# Patient Record
Sex: Female | Born: 1995 | Race: White | Hispanic: No | Marital: Single | Smoking: Current every day smoker
Health system: Southern US, Community
[De-identification: ages and names within clinical notes are randomized; demographics above are authoritative.]

## PROBLEM LIST (undated history)

## (undated) DIAGNOSIS — D649 Anemia, unspecified: Secondary | ICD-10-CM

## (undated) HISTORY — DX: Anemia, unspecified: D64.9

---
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
## 2018-03-27 DIAGNOSIS — F4310 Post-traumatic stress disorder, unspecified: Secondary | ICD-10-CM | POA: Insufficient documentation

## 2018-03-27 DIAGNOSIS — G4730 Sleep apnea, unspecified: Secondary | ICD-10-CM | POA: Insufficient documentation

## 2018-03-27 DIAGNOSIS — Z62810 Personal history of physical and sexual abuse in childhood: Secondary | ICD-10-CM | POA: Insufficient documentation

## 2018-03-27 LAB — HM HIV SCREENING LAB: HM HIV Screening: NEGATIVE

## 2018-05-09 ENCOUNTER — Encounter: Payer: Self-pay | Admitting: Emergency Medicine

## 2018-05-09 ENCOUNTER — Other Ambulatory Visit: Payer: Self-pay

## 2018-05-09 DIAGNOSIS — Z8619 Personal history of other infectious and parasitic diseases: Secondary | ICD-10-CM | POA: Diagnosis not present

## 2018-05-09 DIAGNOSIS — B373 Candidiasis of vulva and vagina: Secondary | ICD-10-CM | POA: Insufficient documentation

## 2018-05-09 DIAGNOSIS — N898 Other specified noninflammatory disorders of vagina: Secondary | ICD-10-CM | POA: Diagnosis present

## 2018-05-09 LAB — CBC
HEMATOCRIT: 34 % — AB (ref 35.0–47.0)
Hemoglobin: 12 g/dL (ref 12.0–16.0)
MCH: 30.6 pg (ref 26.0–34.0)
MCHC: 35.3 g/dL (ref 32.0–36.0)
MCV: 86.5 fL (ref 80.0–100.0)
PLATELETS: 232 10*3/uL (ref 150–440)
RBC: 3.93 MIL/uL (ref 3.80–5.20)
RDW: 15 % — AB (ref 11.5–14.5)
WBC: 9.7 10*3/uL (ref 3.6–11.0)

## 2018-05-09 LAB — POCT PREGNANCY, URINE: Preg Test, Ur: NEGATIVE

## 2018-05-09 NOTE — ED Triage Notes (Signed)
Pt diagnosed with chlamydia over a week ago; says taking antibiotic as prescribed but feeling worse; having abd pain, back and and vaginal itching; abd pain to lower midline; denies N/V; reports less urine output with dysuria;

## 2018-05-10 ENCOUNTER — Emergency Department
Admission: EM | Admit: 2018-05-10 | Discharge: 2018-05-10 | Disposition: A | Discharge status: Home / Self Care | Payer: Medicaid Other | Attending: Emergency Medicine | Admitting: Emergency Medicine

## 2018-05-10 DIAGNOSIS — B373 Candidiasis of vulva and vagina: Secondary | ICD-10-CM

## 2018-05-10 DIAGNOSIS — B3731 Acute candidiasis of vulva and vagina: Secondary | ICD-10-CM

## 2018-05-10 LAB — URINALYSIS, COMPLETE (UACMP) WITH MICROSCOPIC
Bilirubin Urine: NEGATIVE
Glucose, UA: NEGATIVE mg/dL
Hgb urine dipstick: NEGATIVE
Ketones, ur: 5 mg/dL — AB
Nitrite: NEGATIVE
PROTEIN: 30 mg/dL — AB
SPECIFIC GRAVITY, URINE: 1.034 — AB (ref 1.005–1.030)
pH: 5 (ref 5.0–8.0)

## 2018-05-10 LAB — COMPREHENSIVE METABOLIC PANEL
ALBUMIN: 3.9 g/dL (ref 3.5–5.0)
ALK PHOS: 59 U/L (ref 38–126)
ALT: 12 U/L — AB (ref 14–54)
AST: 19 U/L (ref 15–41)
Anion gap: 7 (ref 5–15)
BILIRUBIN TOTAL: 0.3 mg/dL (ref 0.3–1.2)
BUN: 9 mg/dL (ref 6–20)
CALCIUM: 9.1 mg/dL (ref 8.9–10.3)
CO2: 25 mmol/L (ref 22–32)
CREATININE: 0.77 mg/dL (ref 0.44–1.00)
Chloride: 107 mmol/L (ref 101–111)
GFR calc Af Amer: >60 mL/min (ref >60)
GLUCOSE: 85 mg/dL (ref 65–99)
POTASSIUM: 3.7 mmol/L (ref 3.5–5.1)
Sodium: 139 mmol/L (ref 135–145)
TOTAL PROTEIN: 6.9 g/dL (ref 6.5–8.1)

## 2018-05-10 LAB — CHLAMYDIA/NGC RT PCR (ARMC ONLY)
Chlamydia Tr: NOT DETECTED
N gonorrhoeae: NOT DETECTED

## 2018-05-10 LAB — WET PREP, GENITAL
CLUE CELLS WET PREP: NONE SEEN
SPERM: NONE SEEN
Trich, Wet Prep: NONE SEEN

## 2018-05-10 MED ORDER — FLUCONAZOLE 100 MG PO TABS
150.0000 mg | ORAL_TABLET | Freq: Once | ORAL | Status: AC
  Administered 2018-05-10: 150 mg via ORAL
  Filled 2018-05-10: qty 1

## 2018-05-10 NOTE — ED Provider Notes (Signed)
[facility] Emergency Department Provider Note  Time seen: 3:11 AM  I have reviewed the triage vital signs and the nursing notes.   HISTORY  Chief Complaint Abdominal Pain; Dysuria; and Vaginal Itching    HPI Janet Leon is a 22 y.o. female with no significant past medical history presents to the emergency department for vaginal discharge itching burning.  According to the patient approximately 10 days ago she was seen for vaginal discharge and lower abdominal pain diagnosed with chlamydia.  Patient was placed on antibiotics which she took twice a day for 7 days.  States the vaginal discharge improved and lower abdominal pain went away.  Patient states over the past few days she is now having burning and itching vaginally.  Denies any fever.  Denies any current abdominal pain, nausea or vomiting.   History reviewed. No pertinent past medical history.  There are no active problems to display for this patient.   History reviewed. No pertinent surgical history.  Prior to Admission medications   Medication Sig Start Date End Date Taking? Authorizing Provider  doxycycline (MONODOX) 100 MG capsule Take 100 mg by mouth 2 (two) times daily.   Yes [provider]    Allergies  Allergen Reactions  . Amoxicillin Rash  . Penicillins Rash    History reviewed. No pertinent family history.  Social History Social History   Tobacco Use  . Smoking status: Never Smoker  . Smokeless tobacco: Never Used  Substance Use Topics  . Alcohol use: No  . Drug use: Never    Review of Systems Constitutional: Negative for fever. Cardiovascular: Negative for chest pain. Respiratory: Negative for shortness of breath. Gastrointestinal: Lower abdominal pain has resolved. Genitourinary: Vaginal burning and itching.  Minimal discharge. All other ROS negative  ____________________________________________   PHYSICAL EXAM:  VITAL SIGNS: ED Triage Vitals  Enc  Vitals Group     BP 05/09/18 2319 123/78     Pulse Rate 05/09/18 2319 81     Resp 05/09/18 2319 16     Temp 05/09/18 2319 98.5 F (36.9 C)     Temp Source 05/09/18 2319 Oral     SpO2 05/09/18 2319 98 %     Weight 05/09/18 2324 106 lb (48.1 kg)     Height 05/09/18 2324 5\' 4"  (1.626 m)     Head Circumference --      Peak Flow --      Pain Score 05/09/18 2323 4     Pain Loc --      Pain Edu? --      Excl. in GC? --     Constitutional: Alert and oriented. Well appearing Eyes: Normal exam ENT   Head: Normocephalic and atraumatic   Mouth/Throat: Mucous membranes are moist. Cardiovascular: Normal rate, regular rhythm. No murmur Respiratory: Normal respiratory effort without tachypnea nor retractions. Breath sounds are clear  Gastrointestinal: Soft and nontender. No distention.  Genitourinary: Patient has white sparse vaginal discharge most consistent with Candida.  No cervical erythema.  Largely nontender. Musculoskeletal: Nontender with normal range of motion in all extremities.  Neurologic:  Normal speech and language. No gross focal neurologic deficits Skin:  Skin is warm, dry and intact.  Psychiatric: Mood and affect are normal.   ____________________________________________    INITIAL IMPRESSION / ASSESSMENT AND PLAN / ED COURSE  Pertinent labs & imaging results that were available during my care of the patient were reviewed by me and considered in my medical decision making (see chart for  details).  Patient presents to the emergency department for vaginal burning and itching.  Patient recently completed a course of antibiotics for chlamydia.  Differential would include resistant chlamydia, candida, BV.  Overall the patient appears well.  Pelvic examination is most consistent with yeast infection.  We will send a wet prep and repeat a GC/chlamydia.  Patient agreeable to plan of care.  Patient's blood work is largely normal.  Urinalysis shows white cells, somewhat  equivocal we will send urine culture.  Positive for yeast infection.  We will treat with Diflucan and have the patient follow-up with her doctor. ____________________________________________   FINAL CLINICAL IMPRESSION(S) / ED DIAGNOSES  Vaginal yeast infection    Minna Antis, MD 05/10/18 [phone]

## 2018-05-10 NOTE — ED Notes (Signed)
Pt waiting patiently in [facility] for treatment room;  

## 2018-05-11 LAB — URINE CULTURE: Culture: <10000 — AB

## 2018-05-25 ENCOUNTER — Emergency Department: Payer: Medicaid Other

## 2018-05-25 ENCOUNTER — Other Ambulatory Visit: Payer: Self-pay

## 2018-05-25 ENCOUNTER — Encounter: Payer: Self-pay | Admitting: Emergency Medicine

## 2018-05-25 ENCOUNTER — Telehealth: Payer: Self-pay | Admitting: Emergency Medicine

## 2018-05-25 ENCOUNTER — Emergency Department
Admission: EM | Admit: 2018-05-25 | Discharge: 2018-05-25 | Disposition: A | Discharge status: Home / Self Care | Payer: Medicaid Other | Attending: Emergency Medicine | Admitting: Emergency Medicine

## 2018-05-25 DIAGNOSIS — R1030 Lower abdominal pain, unspecified: Secondary | ICD-10-CM | POA: Insufficient documentation

## 2018-05-25 LAB — URINALYSIS, COMPLETE (UACMP) WITH MICROSCOPIC
BILIRUBIN URINE: NEGATIVE
GLUCOSE, UA: NEGATIVE mg/dL
HGB URINE DIPSTICK: NEGATIVE
Ketones, ur: NEGATIVE mg/dL
LEUKOCYTES UA: NEGATIVE
NITRITE: NEGATIVE
PROTEIN: 30 mg/dL — AB
Specific Gravity, Urine: 1.017 (ref 1.005–1.030)
pH: 6 (ref 5.0–8.0)

## 2018-05-25 LAB — CHLAMYDIA/NGC RT PCR (ARMC ONLY)
Chlamydia Tr: NOT DETECTED
N gonorrhoeae: NOT DETECTED

## 2018-05-25 LAB — WET PREP, GENITAL
Clue Cells Wet Prep HPF POC: NONE SEEN
SPERM: NONE SEEN
TRICH WET PREP: NONE SEEN
YEAST WET PREP: NONE SEEN

## 2018-05-25 LAB — POCT PREGNANCY, URINE: Preg Test, Ur: NEGATIVE

## 2018-05-25 NOTE — Telephone Encounter (Signed)
Called patient to give her results of std testing.  I explained that the gonorrhea and chlamydia tests were negative.  Also asked about her ovary and the cyst.  I explained that there was a cyst, but that may not be the source of her pain.  She is going to pcp for follow up. I told her that if she continues to have pelvic pain she should ask her pcp about next steps for follow up.

## 2018-05-25 NOTE — ED Triage Notes (Signed)
Patient to ED for lower abdominal cramping. States she did finish her antibiotics as did her partner. This pain has been going on for a couple of days.

## 2018-05-25 NOTE — Discharge Instructions (Signed)
Fortunately today your ultrasound was reassuring.  Please follow-up with your primary care physician in 2 days for recheck and return to the emergency department sooner for any concerns.  It was a pleasure to take care of you today, and thank you for coming to our emergency department.  If you have any questions or concerns before leaving please ask the nurse to grab me and I'm more than happy to go through your aftercare instructions again.  If you were prescribed any opioid pain medication today such as Norco, Vicodin, Percocet, morphine, hydrocodone, or oxycodone please make sure you do not drive when you are taking this medication as it can alter your ability to drive safely.  If you have any concerns once you are home that you are not improving or are in fact getting worse before you can make it to your follow-up appointment, please do not hesitate to call 911 and come back for further evaluation.  Merrily BrittleNeil Ronee Ranganathan, MD  Results for orders placed or performed during the hospital encounter of 05/25/18  Wet prep, genital  Result Value Ref Range   Yeast Wet Prep HPF POC NONE SEEN NONE SEEN   Trich, Wet Prep NONE SEEN NONE SEEN   Clue Cells Wet Prep HPF POC NONE SEEN NONE SEEN   WBC, Wet Prep HPF POC FEW (A) NONE SEEN   Sperm NONE SEEN   Urinalysis, Complete w Microscopic  Result Value Ref Range   Color, Urine YELLOW (A) YELLOW   APPearance CLEAR (A) CLEAR   Specific Gravity, Urine 1.017 1.005 - 1.030   pH 6.0 5.0 - 8.0   Glucose, UA NEGATIVE NEGATIVE mg/dL   Hgb urine dipstick NEGATIVE NEGATIVE   Bilirubin Urine NEGATIVE NEGATIVE   Ketones, ur NEGATIVE NEGATIVE mg/dL   Protein, ur 30 (A) NEGATIVE mg/dL   Nitrite NEGATIVE NEGATIVE   Leukocytes, UA NEGATIVE NEGATIVE   RBC / HPF 0-5 0 - 5 RBC/hpf   WBC, UA 0-5 0 - 5 WBC/hpf   Bacteria, UA RARE (A) NONE SEEN   Squamous Epithelial / LPF 0-5 0 - 5   Mucus PRESENT    Hyaline Casts, UA PRESENT   Pregnancy, urine POC  Result Value Ref  Range   Preg Test, Ur NEGATIVE NEGATIVE   [place] Transvaginal Non-ob  Result Date: 05/25/2018 CLINICAL DATA:  Initial evaluation for acute lower abdominal pain for 2 days. Evaluate for TOA. EXAM: TRANSABDOMINAL AND TRANSVAGINAL ULTRASOUND OF PELVIS TECHNIQUE: Both transabdominal and transvaginal ultrasound examinations of the pelvis were performed. Transabdominal technique was performed for global imaging of the pelvis including uterus, ovaries, adnexal regions, and pelvic cul-de-sac. It was necessary to proceed with endovaginal exam following the transabdominal exam to visualize the pelvic structures. COMPARISON:  None FINDINGS: Uterus Measurements: 7.5 x 2.9 x 4.1 cm. No fibroids or other mass visualized. Endometrium Thickness: 7 mm.  No focal abnormality visualized. Right ovary Measurements: 3.5 x 2.2 x 2.9 cm. Normal appearance/no adnexal mass. 1.8 x 1.7 x 2.3 cm dominant follicle/physiologic cyst noted. Left ovary Measurements: 2.1 x 1.6 x 2.0 cm. Normal appearance/no adnexal mass. Other findings No abnormal free fluid. IMPRESSION: 1. No acute abnormality within the pelvis.  No evidence for TOA. 2. 2.3 cm right ovarian physiologic cyst. Electronically Signed   By: Rise MuBenjamin  McClintock M.D.   On: 05/25/2018 05:02   [place] Pelvis Complete  Result Date: 05/25/2018 CLINICAL DATA:  Initial evaluation for acute lower abdominal pain for 2 days. Evaluate for TOA. EXAM: TRANSABDOMINAL AND TRANSVAGINAL ULTRASOUND  OF PELVIS TECHNIQUE: Both transabdominal and transvaginal ultrasound examinations of the pelvis were performed. Transabdominal technique was performed for global imaging of the pelvis including uterus, ovaries, adnexal regions, and pelvic cul-de-sac. It was necessary to proceed with endovaginal exam following the transabdominal exam to visualize the pelvic structures. COMPARISON:  None FINDINGS: Uterus Measurements: 7.5 x 2.9 x 4.1 cm. No fibroids or other mass visualized. Endometrium Thickness: 7 mm.  No focal  abnormality visualized. Right ovary Measurements: 3.5 x 2.2 x 2.9 cm. Normal appearance/no adnexal mass. 1.8 x 1.7 x 2.3 cm dominant follicle/physiologic cyst noted. Left ovary Measurements: 2.1 x 1.6 x 2.0 cm. Normal appearance/no adnexal mass. Other findings No abnormal free fluid. IMPRESSION: 1. No acute abnormality within the pelvis.  No evidence for TOA. 2. 2.3 cm right ovarian physiologic cyst. Electronically Signed   By: Rise Mu M.D.   On: 05/25/2018 05:02

## 2018-05-25 NOTE — ED Provider Notes (Signed)
[facility] Emergency Department Provider Note  ____________________________________________   First MD Initiated Contact with Patient 05/25/18 [phone]     (approximate)  I have reviewed the triage vital signs and the nursing notes.   HISTORY  Chief Complaint Abdominal Pain   HPI Janet Leon is a 22 y.o. female who self presents to the emergency department with roughly 2 days of lower abdominal cramping discomfort.  Her symptoms initially began about 3-1/2 weeks ago when she was diagnosed with chlamydia.  She [facility] with a "shot and some pills".  Her partner was seen and treated as well.  She was then seen in our emergency department about 2 weeks ago with persistent symptoms and received an additional course of antibiotics given the concern for persistent chlamydia.  Her symptoms then subsequently resolved until 2 days ago.  She denies dysuria however she does note increasing vaginal discharge.  She has not had sex in about 10 days.  She denies fevers or chills.  Her abdominal pain is mild to moderate lower nothing seems to make it better or worse.  It is mostly constant and sharp.  It is nonradiating.    History reviewed. No pertinent past medical history.  There are no active problems to display for this patient.   History reviewed. No pertinent surgical history.  Prior to Admission medications   Medication Sig Start Date End Date Taking? Authorizing Provider  doxycycline (MONODOX) 100 MG capsule Take 100 mg by mouth 2 (two) times daily.    [provider]    Allergies Amoxicillin and Penicillins  No family history on file.  Social History Social History   Tobacco Use  . Smoking status: Never Smoker  . Smokeless tobacco: Never Used  Substance Use Topics  . Alcohol use: No  . Drug use: Never    Review of Systems Constitutional: No fever/chills Eyes: No visual changes. ENT: No sore  throat. Cardiovascular: Denies chest pain. Respiratory: Denies shortness of breath. Gastrointestinal: Positive for abdominal pain.  Positive for nausea, no vomiting.  No diarrhea.  No constipation. Genitourinary: Positive for vaginal discharge Musculoskeletal: Negative for back pain. Skin: Negative for rash. Neurological: Negative for headaches, focal weakness or numbness.   ____________________________________________   PHYSICAL EXAM:  VITAL SIGNS: ED Triage Vitals  Enc Vitals Group     BP 05/25/18 0251 102/60     Pulse Rate 05/25/18 0251 99     Resp 05/25/18 0251 19     Temp 05/25/18 0251 98.7 F (37.1 C)     Temp src --      SpO2 05/25/18 0251 100 %     Weight 05/25/18 0248 106 lb (48.1 kg)     Height 05/25/18 0248 5\' 4"  (1.626 m)     Head Circumference --      Peak Flow --      Pain Score 05/25/18 0248 4     Pain Loc --      Pain Edu? --      Excl. in GC? --     Constitutional: Alert and oriented x4 well-appearing nontoxic no diaphoresis speaks in full clear sentences Eyes: PERRL EOMI. Head: Atraumatic. Nose: No congestion/rhinnorhea. Mouth/Throat: No trismus Neck: No stridor.   Cardiovascular: Normal rate, regular rhythm. Grossly normal heart sounds.  Good peripheral circulation. Respiratory: Normal respiratory effort.  No retractions. Lungs CTAB and moving good air Gastrointestinal: Soft nondistended mild lower abdominal tenderness with no rebound or guarding no peritonitis no  McBurney's tenderness Pelvic exam performed with female chaperone Lashea: Normal external exam.  Small amount of mucoid discharge from the cervix.  No cervical motion adnexal or uterine tenderness Musculoskeletal: No lower extremity edema   Neurologic:  Normal speech and language. No gross focal neurologic deficits are appreciated. Skin:  Skin is warm, dry and intact. No rash noted. Psychiatric: Mood and affect are normal. Speech and behavior are  normal.    ____________________________________________   DIFFERENTIAL includes but not limited to  Tubo-ovarian abscess, PID, cervicitis, gonorrhea diverticulitis, ovarian cyst ____________________________________________   LABS (all labs ordered are listed, but only abnormal results are displayed)  Labs Reviewed  WET PREP, GENITAL - Abnormal; Notable for the following components:      Result Value   WBC, Wet Prep HPF POC FEW (*)    All other components within normal limits  URINALYSIS, COMPLETE (UACMP) WITH MICROSCOPIC - Abnormal; Notable for the following components:   Color, Urine YELLOW (*)    APPearance CLEAR (*)    Protein, ur 30 (*)    Bacteria, UA RARE (*)    All other components within normal limits  CHLAMYDIA/NGC RT PCR ([facility] ONLY)  POC URINE PREG, ED  POCT PREGNANCY, URINE    Lab work reviewed by me with no acute disease noted __________________________________________  EKG   ____________________________________________  RADIOLOGY  Pelvic ultrasound reviewed by me with right sided cyst otherwise unremarkable ____________________________________________   PROCEDURES  Procedure(s) performed: no  Procedures  Critical Care performed: no  ____________________________________________   INITIAL IMPRESSION / ASSESSMENT AND PLAN / ED COURSE  Pertinent labs & imaging results that were available during my care of the patient were reviewed by me and considered in my medical decision making (see chart for details).   The patient arrives quite well-appearing although with recurrent pelvic symptoms in the setting of a recent diagnosis of chlamydia.  I do have some concern for TOA versus recurrent PID so pelvic ultrasound and pelvic exam are pending.  The patient's pelvic ultrasound is unremarkable.  Her pelvic exam was reassuring.  I have sent GC/CT but I reassured the patient.  2-day abdominal recheck given.  She is discharged home in stable  condition verbalizes understanding and agreement with the plan.      ____________________________________________   FINAL CLINICAL IMPRESSION(S) / ED DIAGNOSES  Final diagnoses:  Lower abdominal pain      NEW MEDICATIONS STARTED DURING THIS VISIT:  Discharge Medication List as of 05/25/2018  5:34 AM       Note:  This document was prepared using Dragon voice recognition software and may include unintentional dictation errors.     Merrily Brittle, MD 05/25/18 [phone]

## 2018-05-25 NOTE — ED Notes (Signed)
Pt states is here for generalized abd pain. Pt states pain is sometimes in lower abd, pelvis and ruq. Pt appears in no acute distress. Pt denies fever, diarrhea, nausea, vomiting. Pt appears in no acute distress.

## 2019-01-07 DIAGNOSIS — G4730 Sleep apnea, unspecified: Secondary | ICD-10-CM

## 2019-01-07 DIAGNOSIS — F4310 Post-traumatic stress disorder, unspecified: Secondary | ICD-10-CM

## 2019-01-07 DIAGNOSIS — F32A Depression, unspecified: Secondary | ICD-10-CM

## 2019-01-07 DIAGNOSIS — F329 Major depressive disorder, single episode, unspecified: Secondary | ICD-10-CM

## 2019-01-07 DIAGNOSIS — Z62810 Personal history of physical and sexual abuse in childhood: Secondary | ICD-10-CM

## 2019-08-05 ENCOUNTER — Encounter: Payer: Self-pay | Admitting: Family Medicine

## 2019-08-05 ENCOUNTER — Ambulatory Visit (LOCAL_COMMUNITY_HEALTH_CENTER): Payer: Self-pay | Admitting: Family Medicine

## 2019-08-05 ENCOUNTER — Other Ambulatory Visit: Payer: Self-pay

## 2019-08-05 ENCOUNTER — Ambulatory Visit: Payer: Self-pay | Admitting: Family Medicine

## 2019-08-05 DIAGNOSIS — Z01419 Encounter for gynecological examination (general) (routine) without abnormal findings: Secondary | ICD-10-CM

## 2019-08-05 DIAGNOSIS — Z113 Encounter for screening for infections with a predominantly sexual mode of transmission: Secondary | ICD-10-CM

## 2019-08-05 DIAGNOSIS — B379 Candidiasis, unspecified: Secondary | ICD-10-CM

## 2019-08-05 DIAGNOSIS — N898 Other specified noninflammatory disorders of vagina: Secondary | ICD-10-CM

## 2019-08-05 DIAGNOSIS — F1290 Cannabis use, unspecified, uncomplicated: Secondary | ICD-10-CM

## 2019-08-05 LAB — WET PREP FOR TRICH, YEAST, CLUE: Trichomonas Exam: NEGATIVE

## 2019-08-05 MED ORDER — CLOTRIMAZOLE 1 % VA CREA: 1.0000 | TOPICAL_CREAM | Freq: Every day | VAGINAL | 0 refills | Status: AC
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
## 2019-08-05 NOTE — Progress Notes (Signed)
Wet mount reviewed, pt treated for yeast per standing order. Provider orders completed. 
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
## 2019-08-09 LAB — GONOCOCCUS CULTURE

## 2019-08-11 LAB — IGP, RFX APTIMA HPV ASCU: PAP Smear Comment: 0

## 2019-08-12 LAB — HEPATITIS B SURFACE ANTIGEN

## 2019-08-17 LAB — HM HEPATITIS C SCREENING LAB: HM Hepatitis Screen: NEGATIVE

## 2019-08-17 LAB — HM HIV SCREENING LAB: HM HIV Screening: NEGATIVE

## 2019-08-25 ENCOUNTER — Telehealth: Payer: Self-pay | Admitting: Family Medicine

## 2019-08-25 ENCOUNTER — Telehealth: Payer: Self-pay | Admitting: General Practice

## 2019-08-25 NOTE — Telephone Encounter (Signed)
Test Results

## 2019-08-25 NOTE — Telephone Encounter (Signed)
WANTS TR °

## 2019-08-26 NOTE — Telephone Encounter (Signed)
See other phone note Lea Walbert, RN  

## 2019-08-26 NOTE — Telephone Encounter (Signed)
TC with patient.  Verified ID via password.  Discussed neg HIV/RPR/Hep/ and GC/Chlamydia. Patient reports received pap card. Aileen Fass, RN

## 2019-09-01 NOTE — Addendum Note (Signed)
Addended by: Caryl Bis on: 09/01/2019 05:57 PM   Modules accepted: Orders

## 2019-10-24 IMAGING — US US TRANSVAGINAL NON-OB
1 series · 14 of 25 positions shown · non-contrast
Comparison: None

CLINICAL DATA: Initial evaluation for acute lower abdominal pain
for 2 days. Evaluate for TOA.

EXAM:
TRANSABDOMINAL AND TRANSVAGINAL ULTRASOUND OF PELVIS
TECHNIQUE: Both transabdominal and transvaginal ultrasound examinations of the
pelvis were performed. Transabdominal technique was performed for
global imaging of the pelvis including uterus, ovaries, adnexal
regions, and pelvic cul-de-sac. It was necessary to proceed with
endovaginal exam following the transabdominal exam to visualize the
pelvic structures.

[Series 1: us transvaginal non-ob · 0.17mm/px · 14 of 114 slices shown]
[im 1/114]
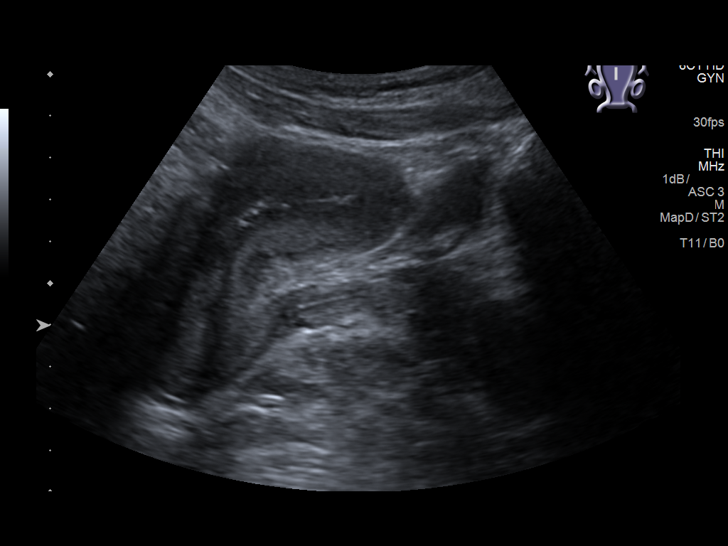
[im 10/114]
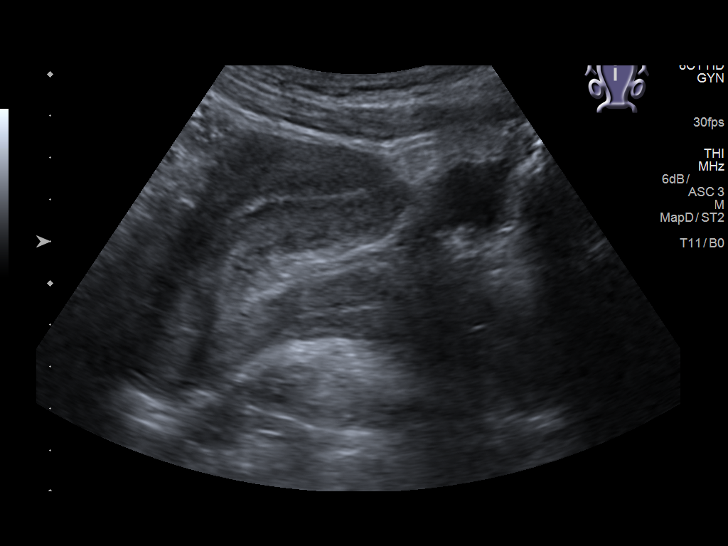
[im 19/114]
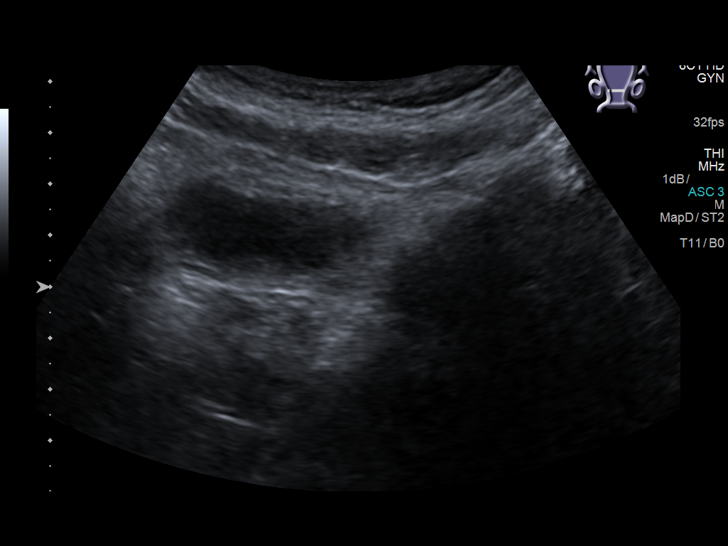
[im 29/114]
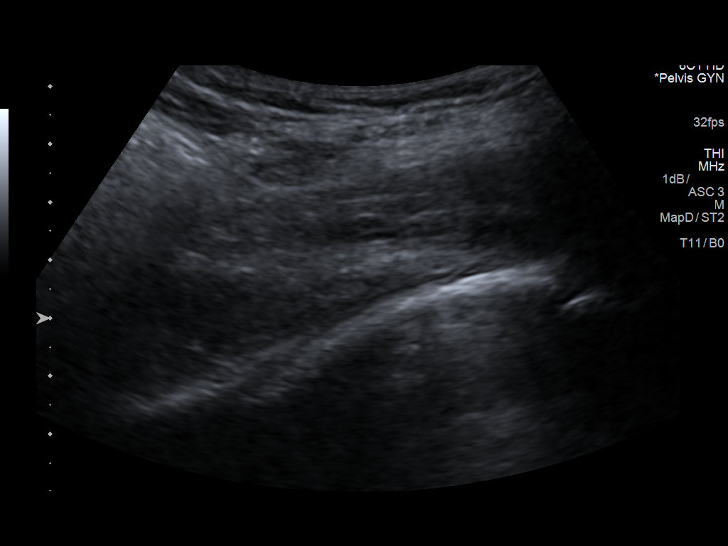
[im 38/114]
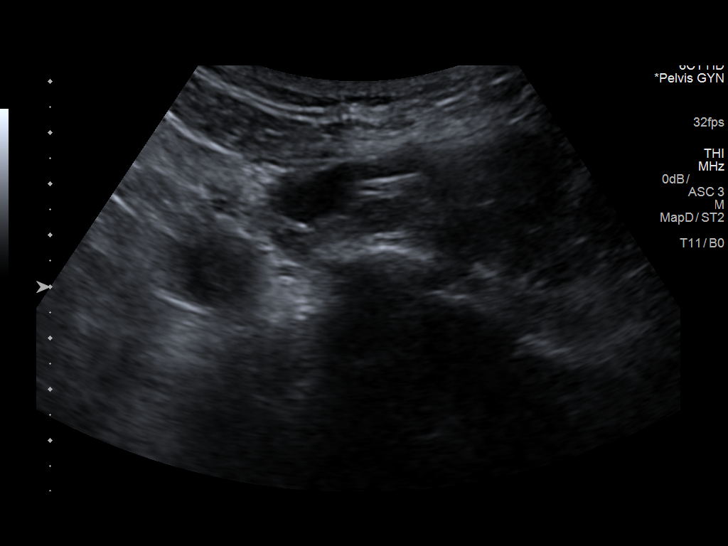
[im 43/114]
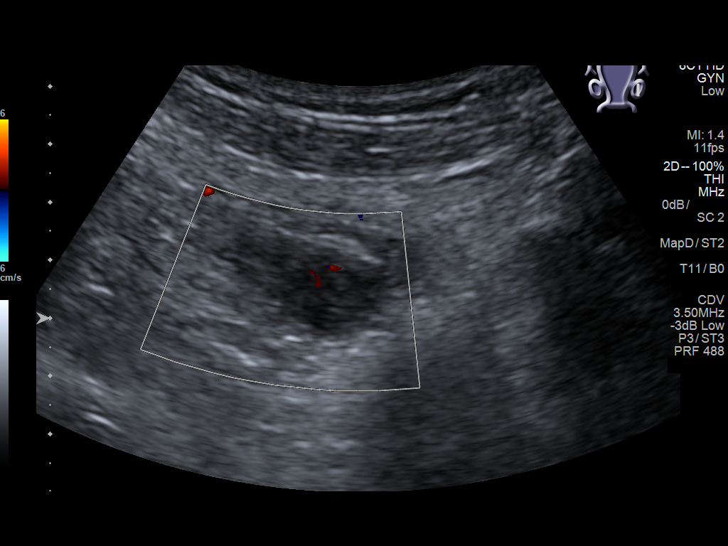
[im 52/114]
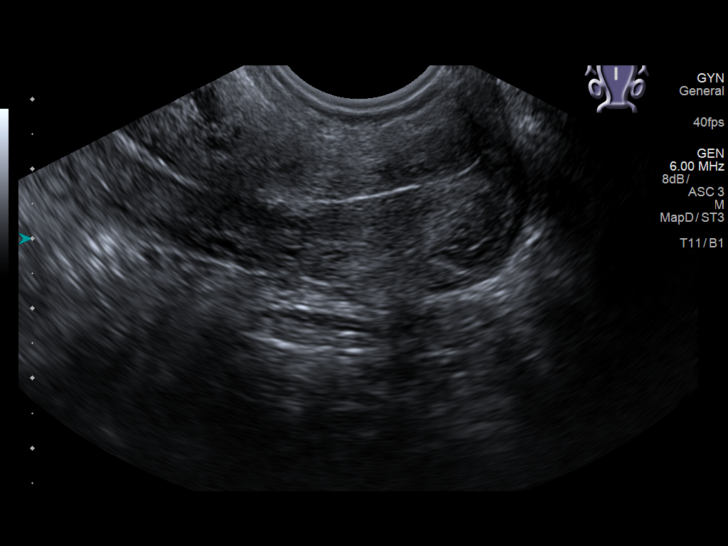
[im 62/114]
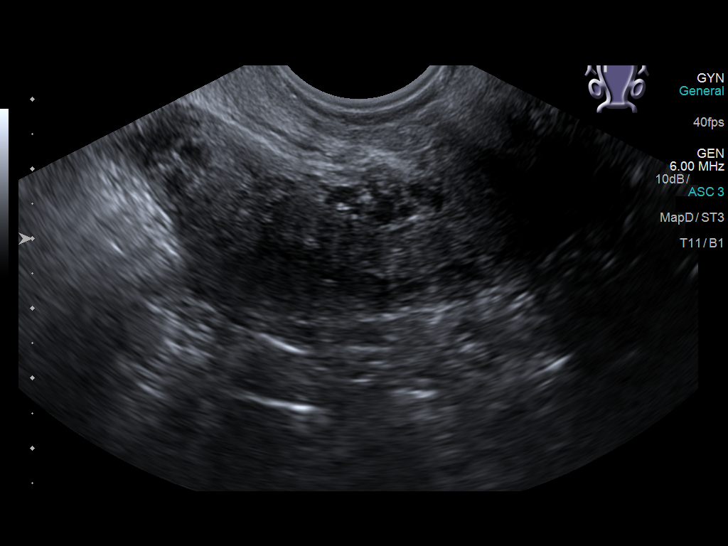
[im 71/114]
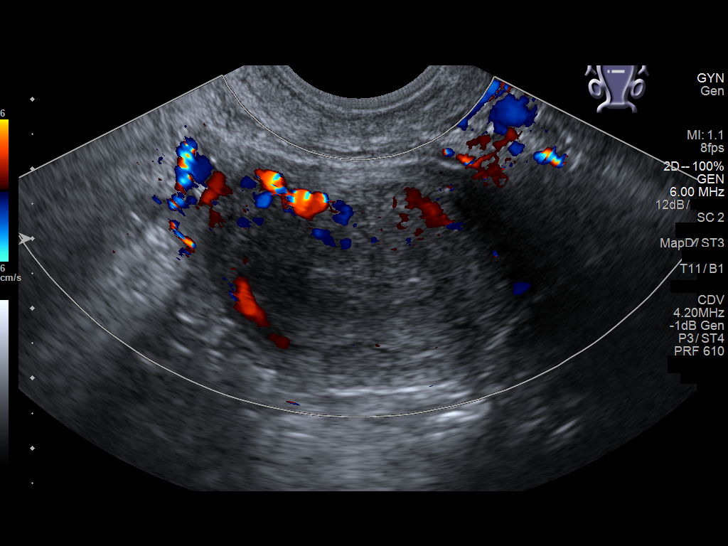
[im 76/114]
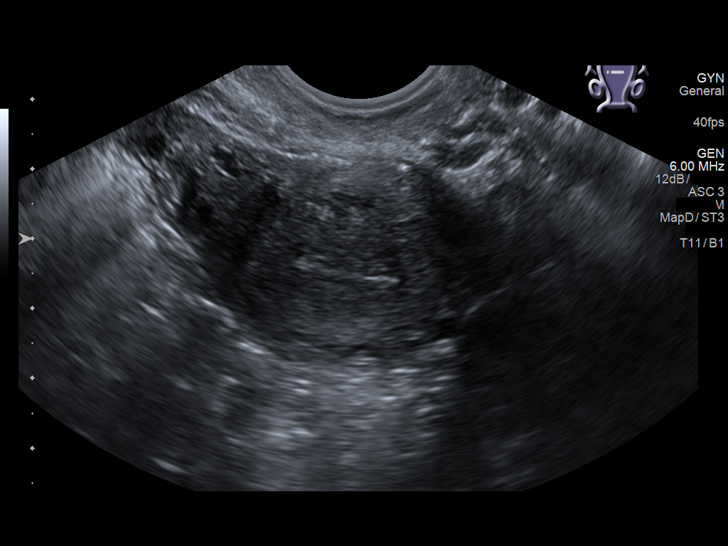
[im 85/114]
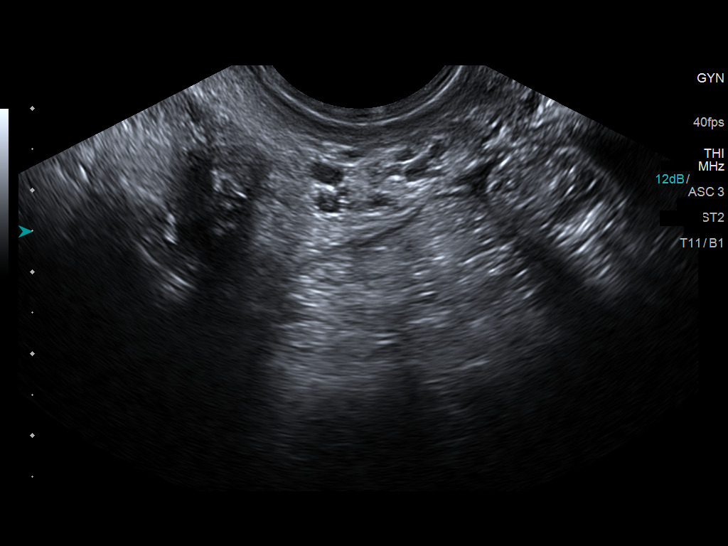
[im 95/114]
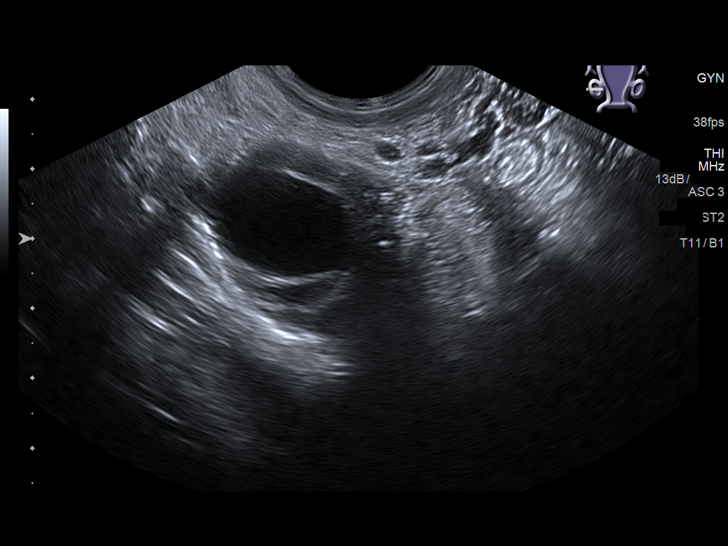
[im 104/114]
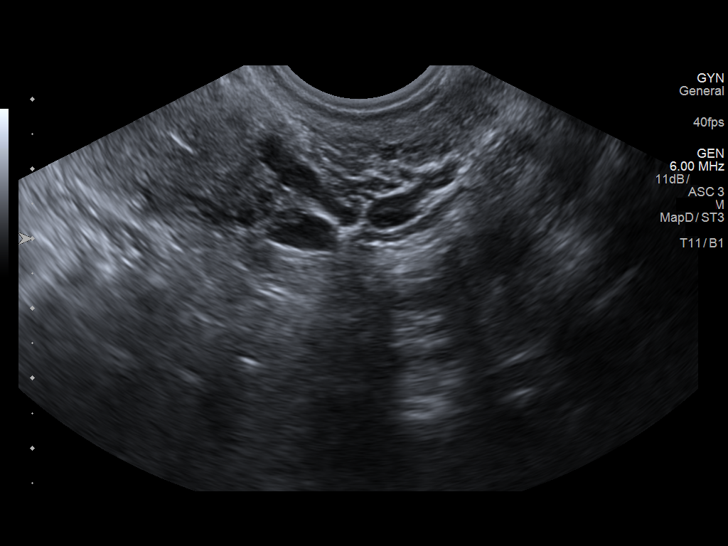
[im 114/114]
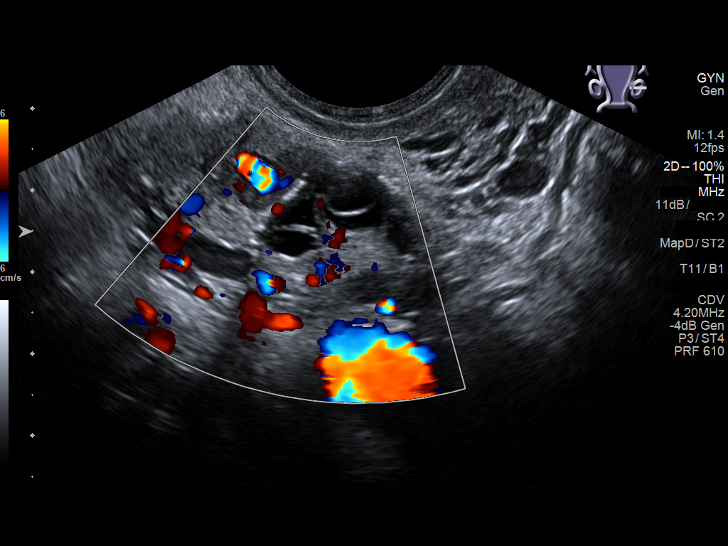

[14 of 25 positions shown; findings below may reference images not displayed]

FINDINGS: Uterus

Measurements: 7.5 x 2.9 x 4.1 cm. No fibroids or other mass
visualized.

Endometrium

Thickness: 7 mm.  No focal abnormality visualized.

Right ovary

Measurements: 3.5 x 2.2 x 2.9 cm. Normal appearance/no adnexal mass.
1.8 x 1.7 x 2.3 cm dominant follicle/physiologic cyst noted.

Left ovary

Measurements: 2.1 x 1.6 x 2.0 cm. Normal appearance/no adnexal mass.

Other findings

No abnormal free fluid.
IMPRESSION: 1. No acute abnormality within the pelvis.  No evidence for TOA.
2. 2.3 cm right ovarian physiologic cyst.

## 2020-08-02 ENCOUNTER — Encounter: Payer: Self-pay | Admitting: Physician Assistant

## 2020-08-02 ENCOUNTER — Ambulatory Visit (LOCAL_COMMUNITY_HEALTH_CENTER): Payer: Self-pay | Admitting: Physician Assistant

## 2020-08-02 ENCOUNTER — Other Ambulatory Visit: Payer: Self-pay

## 2020-08-02 VITALS — BP 111/67 | Ht 64.0 in | Wt 118.0 lb

## 2020-08-02 DIAGNOSIS — F329 Major depressive disorder, single episode, unspecified: Secondary | ICD-10-CM

## 2020-08-02 DIAGNOSIS — Z3009 Encounter for other general counseling and advice on contraception: Secondary | ICD-10-CM

## 2020-08-02 DIAGNOSIS — Z113 Encounter for screening for infections with a predominantly sexual mode of transmission: Secondary | ICD-10-CM

## 2020-08-02 DIAGNOSIS — F32A Depression, unspecified: Secondary | ICD-10-CM

## 2020-08-02 DIAGNOSIS — Z0000 Encounter for general adult medical examination without abnormal findings: Secondary | ICD-10-CM

## 2020-08-02 LAB — WET PREP FOR TRICH, YEAST, CLUE
Trichomonas Exam: NEGATIVE
Yeast Exam: NEGATIVE
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
## 2020-08-02 NOTE — Progress Notes (Signed)
Pt is here for physical and STD screening. Pt reports that she has a lump in her right breast that she discussed with PCP about but has concerns about it. Pt also reports irregular periods that started in 06/2020. Pt reports frequent periods with large blood clots. Pt's last sex was 07/29/2020 without condom. Pt reports she is not interested in birth control today. Pt also reports wants to speak with the provider about a skin issue that she is having. Pt filling out PHQ9.

## 2020-08-03 NOTE — Progress Notes (Signed)
Wet mount reviewed and is negative, so no treatment needed for wet mount per standing order. Pt accepted condoms. Counseled pt per provider orders and pt states understanding. Kathreen Cosier, LCSW card and [employer] cards with contact info given to pt per pt request. Provider orders completed.

## 2020-08-09 ENCOUNTER — Other Ambulatory Visit: Payer: Self-pay | Admitting: Family Medicine

## 2020-08-09 LAB — HM HIV SCREENING LAB: HM HIV Screening: NEGATIVE

## 2020-08-09 LAB — HM HEPATITIS C SCREENING LAB: HM Hepatitis Screen: NEGATIVE

## 2020-08-16 ENCOUNTER — Encounter: Payer: Self-pay | Admitting: Physician Assistant

## 2020-08-17 ENCOUNTER — Telehealth: Payer: Self-pay | Admitting: Family Medicine

## 2020-08-17 NOTE — Telephone Encounter (Signed)
Wants test results clarification

## 2020-09-07 ENCOUNTER — Ambulatory Visit: Payer: Self-pay | Admitting: Licensed Clinical Social Worker

## 2020-09-14 ENCOUNTER — Ambulatory Visit: Payer: Self-pay | Admitting: Licensed Clinical Social Worker

## 2020-09-14 ENCOUNTER — Encounter: Payer: Self-pay | Admitting: Licensed Clinical Social Worker

## 2020-09-14 DIAGNOSIS — F4310 Post-traumatic stress disorder, unspecified: Secondary | ICD-10-CM

## 2020-09-14 DIAGNOSIS — F1220 Cannabis dependence, uncomplicated: Secondary | ICD-10-CM

## 2020-09-14 DIAGNOSIS — F411 Generalized anxiety disorder: Secondary | ICD-10-CM
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
## 2020-09-28 ENCOUNTER — Ambulatory Visit: Payer: Self-pay | Admitting: Licensed Clinical Social Worker

## 2020-09-28 DIAGNOSIS — F1220 Cannabis dependence, uncomplicated: Secondary | ICD-10-CM

## 2020-09-28 DIAGNOSIS — F4310 Post-traumatic stress disorder, unspecified: Secondary | ICD-10-CM

## 2020-09-28 DIAGNOSIS — F411 Generalized anxiety disorder: Secondary | ICD-10-CM

## 2020-09-28 NOTE — Progress Notes (Signed)
Counselor/Therapist Progress Note  Patient ID: Tamerra Merkley, MRN: [id number],    Date: 09/28/2020  Time Spent: 47 minutes    Treatment Type: Individual Therapy  Reported Symptoms: Lack of motivation and tired, anxiety, irritability  Mental Status Exam:  Appearance:   Casual and Neat     Behavior:  Appropriate and Sharing  Motor:  Normal  Speech/Language:   Normal Rate  Affect:  Appropriate and Congruent  Mood:  normal  Tho ught process:  normal  Thought content:    WNL  Sensory/Perceptual disturbances:    WNL  Orientation:  oriented to person, place and time/date  Attention:  Good  Concentration:  Fair  Memory:  WNL  Fund of knowledge:   Good  Insight:    Fair  Judgment:   Good  Impulse Control:  Good   Risk Assessment: Danger to Self:  No Self-injurious Behavior: No Danger to Others: No Duty to Warn:no Physical Aggression / Violence:No  Access to Firearms a concern: No  Gang Involvement:No   Subjective: Patient was engaged and cooperative throughout the session using time effectively to discuss goal of treatment and treatment plan. Patient voices continued motivation for treatment and understanding of anxiety issues. Patient is likely to benefit from future treatment because she is motivated to decrease symptoms and improve functioning.   Interventions: Cognitive Behavioral Therapy  Checked in with patient and reviewed previous session, including assessment and goal of treatment. Provided psychoeducation on CBTs. Explored patient's goal of treatment and worked collaboratively to develop CBT treatment plan. Provided support through active listening, validation of feelings, and highlighted patient's strengths.  Diagnosis:   ICD-10-CM   1. Generalized anxiety disorder  F41.1   2. PTSD (post-traumatic stress disorder)  F43.10   3. Cannabis dependence, continuous use (HCC)  F12.20     Plan: Patient's goal is to work on herself, to get her "life together"   Treatment  Target: Understand the relationship between thoughts, emotions, and behaviors  - Psychoeducation on CBT model   - Teach the connection between thoughts, emotions, and behaviors   Treatment Target: Continue to understand mood and anxiety symptoms  - Describe current and past experiences with mood and anxiety symptoms  - Assess anger, depression and anxiety   Treatment Target: Increase realistic balanced thinking  - Explore patient's thoughts, beliefs, automatic thoughts, assumptions  - Identify hot thoughts (upsetting ideas, self-talk and mental images) - Process distress and allow for emotional release  - Evaluate thoughts - Cognitive reappraisal  - Provided psychoeducation on core beliefs, explore, and assist patient in identifying core beliefs   Treatment Target: Reducing vulnerability to "emotional mind" - Values clarification and identification - Self-care - nutrition, sleep, exercise  - Teach mindfulness practices  - Practice mindfulness   Treatment Target: Increase knowledge of child development and positive parenting practices  - Provide psychoeducation on Triple P positive parenting  - Explore current parenting strategies  - Parenting coaching for developing positive relationship with patient, encouraging desirable behavior, developing clear boundaries and behavioral expectations, providing effective commands and managing misbehavior.  - Provide opportunity to explore interventions from Triple P that can support patient in effective parenting practices - Teach behavior management techniques from Triple P to reduce negative behaviors, including consistency, age-appropriate expectations, prompting positive behaviors, using praise and rewards, using clear instructions, and natural consequences to help with listening.  Future Appointments  Date Time Provider Department Center  10/09/2020 12:30 PM Kathreen Cosier, LCSW [facility] None  11/02/2020  3:00 PM Kathreen Cosier,  LCSW [facility] None     Interpreter used: NA  Kathreen Cosier, LCSW

## 2020-10-09 ENCOUNTER — Ambulatory Visit: Payer: Self-pay | Admitting: Licensed Clinical Social Worker

## 2020-10-09 DIAGNOSIS — F4310 Post-traumatic stress disorder, unspecified: Secondary | ICD-10-CM

## 2020-10-09 DIAGNOSIS — F411 Generalized anxiety disorder: Secondary | ICD-10-CM

## 2020-10-09 NOTE — Progress Notes (Signed)
Counselor/Therapist Progress Note  Patient ID: Janet Leon, MRN: [id number],    Date: 10/09/2020  Time Spent: 45 minutes   Treatment Type: Individual Therapy  Reported Symptoms: stable mood, more energy  Mental Status Exam:  Appearance:   Casual and Neat     Behavior:  Appropriate and Sharing  Motor:  Normal  Speech/Language:   Normal Rate  Affect:  Appropriate and Congruent  Mood:  normal  Thought process:  normal  Thought content:    WNL  Sensory/Perceptual disturbances:    WNL  Orientation:  oriented to person, place, time/date and situation  Attention:  Good  Concentration:  Good  Memory:  WNL  Fund of knowledge:   Good  Insight:    Fair  Judgment:   Good  Impulse Control:  Good   Risk Assessment: Danger to Self:  No Self-injurious Behavior: No Danger to Others: No Duty to Warn:no Physical Aggression / Violence:No  Access to Firearms a concern: No  Gang Involvement:No   Subjective: Patient was engaged and cooperative throughout the session using time effectively to discuss thoughts and feelings. Patient voices continued motivation for treatment and understanding of depression and anxiety. Patient is likely to benefit from future treatment because she remains motivated to decrease anxiety and reports benefit from sessions in addressing symptoms.   Interventions: Cognitive Behavioral Therapy Established psychological safety. Checked in with patient regarding her week. Explored patient's current psychosocial stressors, teaching her about thoughts, emotions, and behaviors. Dicussed the role of self-care and living within your values and the impact of stress and anxiety. Began exploring patient's values related to intimate partners, encouraging her to identify what she finds to be important. Provided support through active listening, validation of feelings, and highlighted patient's strengths.   Diagnosis:   ICD-10-CM   1. Generalized anxiety disorder  F41.1   2. PTSD  (post-traumatic stress disorder)  F43.10    Plan: Patient's goal isto work on herself, to get her "life together"   Treatment Target: Understand the relationship between thoughts, emotions, and behaviors   Psychoeducation on CBT model    Teach the connection between thoughts, emotions, and behaviors   Treatment Target: Continue to understand mood and anxiety symptoms   Describe current and past experiences with mood and anxiety symptoms   Assess anger, depression and anxiety   Treatment Target: Increase realistic balanced thinking   Explore patient's thoughts, beliefs, automatic thoughts, assumptions   Identify hot thoughts(upsetting ideas, self-talk and mental images)  Process distress and allow for emotional release   Evaluate thoughts  Cognitive reappraisal   Provided psychoeducation on core beliefs, explore, and assist patient in identifying core beliefs   Treatment Target: Reducing vulnerability to "emotional mind"  Values clarification and identification  Self-care - nutrition, sleep, exercise   Teach mindfulness practices   Practice mindfulness   Treatment Target: Increase knowledge of child development and positive parenting practices   Provide psychoeducation on Triple P positive parenting   Explore current parenting strategies   Parenting coaching for developing positive relationship with patient, encouraging desirable behavior, developing clear boundaries and behavioral expectations, providing effective commands and managing misbehavior.   Provide opportunity to explore interventions from Triple P that can support patient in effective parenting practices  Teach behavior management techniques from Triple P to reduce negative behaviors, including consistency, age-appropriate expectations, prompting positive behaviors, using praise and rewards, using clear instructions, and natural consequences to help with listening. Future Appointments  Date Time  Provider Department Center  10/23/2020  2:00  PM Kathreen Cosier, LCSW [facility] None  11/02/2020  3:00 PM Kathreen Cosier, LCSW [facility] None    Interpreter used: NA   Kathreen Cosier, LCSW

## 2020-10-23 ENCOUNTER — Ambulatory Visit: Payer: Self-pay | Admitting: Licensed Clinical Social Worker

## 2020-10-23 DIAGNOSIS — F411 Generalized anxiety disorder: Secondary | ICD-10-CM

## 2020-10-23 DIAGNOSIS — F4310 Post-traumatic stress disorder, unspecified: Secondary | ICD-10-CM

## 2020-10-23 NOTE — Progress Notes (Signed)
Counselor/Therapist Progress Note  Patient ID: Janet Leon, MRN: [id number],    Date: 10/23/2020  Time Spent: 45 minutes   Treatment Type: Individual Therapy  Reported Symptoms: anxiety, anxious thoughts   Mental Status Exam:  Appearance:   NA     Behavior:  Appropriate and Sharing  Motor:  Normal  Speech/Language:   Normal Rate  Affect:  NA  Mood:  normal  Thought process:  normal  Thought content:    WNL  Sensory/Perceptual disturbances:    WNL  Orientation:  oriented to person, place, time/date and situation  Attention:  Good  Concentration:  Good  Memory:  WNL  Fund of knowledge:   Good  Insight:    Good  Judgment:   Good  Impulse Control:  Good   Risk Assessment: Danger to Self:  No Self-injurious Behavior: No Danger to Others: No Duty to Warn:no Physical Aggression / Violence:No  Access to Firearms a concern: No  Gang Involvement:No   Subjective: Patient was engaged and cooperative throughout the session using time effectively to discuss thoughts and feelings. Patient voices continued motivation for treatment and understanding of depression and anxiety. Patient is likely to benefit from future treatment because she remains motivated to decrease symptoms and reports benefit of regular sessions in addressing these symptoms.   Interventions: Cognitive Behavioral Therapy and Motivational Interviewing Established psychological safety. Checked in with patient regarding her week. Engaged patient in processing current psychosocial stressors, continued patterns of depression and anxiety due to conflict in values. Explored patient's perception of values conflict highlighting and challenging thoughts vs. feelings and encouraging patient to examine her beliefs. Praised patient for stopping marijuana use for 3 days. Explored patient's goals around stopping marijuana use. Provided psychoeducation on withdrawal. Provided support through active listening, validation of feelings,  and highlighted patient's strengths.   Diagnosis:   ICD-10-CM   1. Generalized anxiety disorder  F41.1   2. PTSD (post-traumatic stress disorder)  F43.10     Plan: Patient's goal isto work on herself, toget her "life together"  Treatment Target: Understand the relationship between thoughts, emotions, and behaviors   Psychoeducation on CBT model   Teach the connection between thoughts, emotions, and behaviors   Treatment Target: Continue to understand mood and anxiety symptoms   Describe current and past experiences with mood and anxiety symptoms   Assess anger, depression and anxiety   Treatment Target: Increase realistic balanced thinking   Explore patient's thoughts, beliefs, automatic thoughts, assumptions   Identify hot thoughts(upsetting ideas, self-talk and mental images)  Process distress and allow for emotional release   Evaluate thoughts  Cognitive reappraisal   Provided psychoeducation on core beliefs, explore, and assist patient in identifying core beliefs   Treatment Target: Reducing vulnerability to "emotional mind"  Values clarification and identification  Self-care -nutrition, sleep, exercise   Teach mindfulness practices   Practice mindfulness  Treatment Target:Increase knowledge of child development and positive parenting practices   Provide psychoeducation on Triple P positive parenting   Explore current parenting strategies   Parenting coaching for developing positive relationship with patient, encouraging desirable behavior, developing clear boundaries and behavioral expectations, providing effective commands and managing misbehavior.   Provide opportunity to explore interventions from Triple P that can support patient in effective parenting practices  Teach behavior management techniques from Triple P to reduce negative behaviors, including consistency, age-appropriate expectations, prompting positive behaviors, using praise  and rewards, using clear instructions, and natural consequences to help with listening.  Future Appointments  Date Time  Provider Department Center  11/02/2020  3:00 PM Kathreen Cosier, LCSW [facility] None   Interpreter used: NA  Kathreen Cosier, LCSW

## 2020-10-23 NOTE — Progress Notes (Deleted)
Counselor/Therapist Progress Note  Patient ID: Janet Leon, MRN: [id number],    Date: 10/23/2020  Time Spent: ***   Treatment Type: Individual Therapy  Reported Symptoms: {CHL AMB Reported [phone]}  Mental Status Exam:  Appearance:   {PSY:22683}     Behavior:  {PSY:21022743}  Motor:  {PSY:22302}  Speech/Language:   {PSY:22685}  Affect:  {PSY:22687}  Mood:  {PSY:31886}  Thought process:  {PSY:31888}  Thought content:    {PSY:[id number]}  Sensory/Perceptual disturbances:    {PSY:[phone]}  Orientation:  {PSY:30297}  Attention:  {PSY:22877}  Concentration:  {[phone]}  Memory:  {PSY:[phone]}  Fund of knowledge:   {[phone]}  Insight:    {[phone]}  Judgment:   {[phone]}  Impulse Control:  {[phone]}   Risk Assessment: Danger to Self:  {PSY:22692} Self-injurious Behavior: {PSY:22692} Danger to Others: {PSY:22692} Duty to Warn:{PSY:311194} Physical Aggression / Violence:{PSY:21197} Access to Firearms a concern: {PSY:21197} Gang Involvement:{PSY:21197}  Subjective: Patient was engaged and cooperative throughout the session using time effectively to discuss   Patient voices continued motivation for treatment and understanding of  . Patient is likely to benefit from future treatment because  remains motivated to decrease  And   and reports benefit of regular sessions in addressing these symptoms.   Interventions: {PSY:[phone]} Established psychological safety. Checked in with patient regarding her week. Reviewed previous session regarding  Therapist assisted, actively listened, taught, shared, role/played, provided    Provided support through active listening, validation of feelings, and highlighted patient's strengths.    Diagnosis:   ICD-10-CM   1. Generalized anxiety disorder  F41.1   2. PTSD (post-traumatic stress disorder)  F43.10     Plan: Patient's goal isto work on herself, toget her "life  together"  Treatment Target: Understand the relationship between thoughts, emotions, and behaviors   Psychoeducation on CBT model   Teach the connection between thoughts, emotions, and behaviors   Treatment Target: Continue to understand mood and anxiety symptoms   Describe current and past experiences with mood and anxiety symptoms   Assess anger, depression and anxiety   Treatment Target: Increase realistic balanced thinking   Explore patient's thoughts, beliefs, automatic thoughts, assumptions   Identify hot thoughts(upsetting ideas, self-talk and mental images)  Process distress and allow for emotional release   Evaluate thoughts  Cognitive reappraisal   Provided psychoeducation on core beliefs, explore, and assist patient in identifying core beliefs   Treatment Target: Reducing vulnerability to "emotional mind"  Values clarification and identification  Self-care -nutrition, sleep, exercise   Teach mindfulness practices   Practice mindfulness  Treatment Target:Increase knowledge of child development and positive parenting practices   Provide psychoeducation on [employer] positive parenting   Explore current parenting strategies   Parenting coaching for developing positive relationship with patient, encouraging desirable behavior, developing clear boundaries and behavioral expectations, providing effective commands and managing misbehavior.   Provide opportunity to explore interventions from [employer] that can support patient in effective parenting practices  Teach behavior management techniques from [employer] to reduce negative behaviors, including consistency, age-appropriate expectations, prompting positive behaviors, using praise and rewards, using clear instructions, and natural consequences to help with listening.  Interpreter used: {[employer] Interpreters:[id number]}   Kathreen Cosier, LCSW

## 2020-11-02 ENCOUNTER — Ambulatory Visit: Payer: Self-pay | Admitting: Licensed Clinical Social Worker

## 2021-04-10 ENCOUNTER — Ambulatory Visit: Payer: Self-pay | Admitting: Family Medicine

## 2021-04-10 ENCOUNTER — Encounter: Payer: Self-pay | Admitting: Family Medicine

## 2021-04-10 ENCOUNTER — Other Ambulatory Visit: Payer: Self-pay

## 2021-04-10 DIAGNOSIS — Z113 Encounter for screening for infections with a predominantly sexual mode of transmission: Secondary | ICD-10-CM

## 2021-04-10 LAB — WET PREP FOR TRICH, YEAST, CLUE
Trichomonas Exam: NEGATIVE
Yeast Exam: NEGATIVE
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
## 2021-04-10 NOTE — Progress Notes (Signed)
Wet mount results reviewed with Provider.  No treatment needed.  Condoms given. Berdie Ogren, RN

## 2021-12-13 ENCOUNTER — Encounter: Payer: Self-pay | Admitting: Family Medicine

## 2021-12-13 ENCOUNTER — Other Ambulatory Visit: Payer: Self-pay

## 2021-12-13 ENCOUNTER — Ambulatory Visit: Payer: Self-pay | Admitting: Family Medicine

## 2021-12-13 DIAGNOSIS — Z113 Encounter for screening for infections with a predominantly sexual mode of transmission: Secondary | ICD-10-CM

## 2021-12-13 LAB — WET PREP FOR TRICH, YEAST, CLUE
Trichomonas Exam: NEGATIVE
Yeast Exam: NEGATIVE

## 2021-12-13 LAB — HM HEPATITIS C SCREENING LAB: HM Hepatitis Screen: NEGATIVE

## 2021-12-13 LAB — HEPATITIS B SURFACE ANTIGEN: Hepatitis B Surface Ag: NONREACTIVE

## 2021-12-13 LAB — HM HIV SCREENING LAB: HM HIV Screening: NEGATIVE
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
## 2021-12-13 NOTE — Progress Notes (Signed)
Pt here for STD screening.  Wet mount results reviewed.  Pt declined condoms. Arlando Leisinger M Zaylei Mullane, RN ° °

## 2022-08-27 ENCOUNTER — Inpatient Hospital Stay: Payer: Commercial Managed Care - HMO

## 2022-08-27 ENCOUNTER — Encounter: Payer: Self-pay | Admitting: Emergency Medicine

## 2022-08-27 ENCOUNTER — Emergency Department: Payer: Commercial Managed Care - HMO

## 2022-08-27 ENCOUNTER — Other Ambulatory Visit: Payer: Self-pay

## 2022-08-27 ENCOUNTER — Observation Stay
Admission: EM | Admit: 2022-08-27 | Discharge: 2022-08-29 | Disposition: A | Discharge status: Home / Self Care | Payer: Commercial Managed Care - HMO | Attending: Internal Medicine | Admitting: Internal Medicine

## 2022-08-27 DIAGNOSIS — S0285XA Fracture of orbit, unspecified, initial encounter for closed fracture: Secondary | ICD-10-CM

## 2022-08-27 DIAGNOSIS — G4730 Sleep apnea, unspecified: Secondary | ICD-10-CM | POA: Diagnosis present

## 2022-08-27 DIAGNOSIS — Z800 Family history of malignant neoplasm of digestive organs: Secondary | ICD-10-CM

## 2022-08-27 DIAGNOSIS — S022XXA Fracture of nasal bones, initial encounter for closed fracture: Secondary | ICD-10-CM | POA: Diagnosis not present

## 2022-08-27 DIAGNOSIS — Y929 Unspecified place or not applicable: Secondary | ICD-10-CM | POA: Insufficient documentation

## 2022-08-27 DIAGNOSIS — S62322A Displaced fracture of shaft of third metacarpal bone, right hand, initial encounter for closed fracture: Secondary | ICD-10-CM

## 2022-08-27 DIAGNOSIS — Z23 Encounter for immunization: Secondary | ICD-10-CM | POA: Diagnosis not present

## 2022-08-27 DIAGNOSIS — S0993XA Unspecified injury of face, initial encounter: Secondary | ICD-10-CM | POA: Diagnosis present

## 2022-08-27 DIAGNOSIS — Z8049 Family history of malignant neoplasm of other genital organs: Secondary | ICD-10-CM

## 2022-08-27 DIAGNOSIS — H1131 Conjunctival hemorrhage, right eye: Secondary | ICD-10-CM | POA: Diagnosis present

## 2022-08-27 DIAGNOSIS — Z8041 Family history of malignant neoplasm of ovary: Secondary | ICD-10-CM

## 2022-08-27 DIAGNOSIS — F17290 Nicotine dependence, other tobacco product, uncomplicated: Secondary | ICD-10-CM | POA: Diagnosis present

## 2022-08-27 DIAGNOSIS — S0181XA Laceration without foreign body of other part of head, initial encounter: Secondary | ICD-10-CM | POA: Diagnosis not present

## 2022-08-27 DIAGNOSIS — Z880 Allergy status to penicillin: Secondary | ICD-10-CM

## 2022-08-27 DIAGNOSIS — S0231XA Fracture of orbital floor, right side, initial encounter for closed fracture: Principal | ICD-10-CM | POA: Diagnosis present

## 2022-08-27 DIAGNOSIS — Y939 Activity, unspecified: Secondary | ICD-10-CM | POA: Insufficient documentation

## 2022-08-27 DIAGNOSIS — Y999 Unspecified external cause status: Secondary | ICD-10-CM | POA: Diagnosis not present

## 2022-08-27 DIAGNOSIS — Y92410 Unspecified street and highway as the place of occurrence of the external cause: Secondary | ICD-10-CM

## 2022-08-27 DIAGNOSIS — S0230XB Fracture of orbital floor, unspecified side, initial encounter for open fracture: Secondary | ICD-10-CM

## 2022-08-27 DIAGNOSIS — S62326A Displaced fracture of shaft of fifth metacarpal bone, right hand, initial encounter for closed fracture: Secondary | ICD-10-CM

## 2022-08-27 DIAGNOSIS — Y906 Blood alcohol level of 120-199 mg/100 ml: Secondary | ICD-10-CM | POA: Diagnosis present

## 2022-08-27 DIAGNOSIS — W2209XA Striking against other stationary object, initial encounter: Secondary | ICD-10-CM | POA: Diagnosis present

## 2022-08-27 DIAGNOSIS — S0292XA Unspecified fracture of facial bones, initial encounter for closed fracture: Secondary | ICD-10-CM

## 2022-08-27 DIAGNOSIS — F4312 Post-traumatic stress disorder, chronic: Secondary | ICD-10-CM | POA: Diagnosis present

## 2022-08-27 DIAGNOSIS — G4733 Obstructive sleep apnea (adult) (pediatric): Secondary | ICD-10-CM | POA: Diagnosis present

## 2022-08-27 DIAGNOSIS — F10129 Alcohol abuse with intoxication, unspecified: Secondary | ICD-10-CM

## 2022-08-27 HISTORY — DX: Fracture of orbit, unspecified, initial encounter for closed fracture: S02.85XA

## 2022-08-27 LAB — LIPASE, BLOOD: Lipase: 75 U/L — ABNORMAL HIGH (ref 11–51)

## 2022-08-27 LAB — COMPREHENSIVE METABOLIC PANEL
ALT: 23 U/L (ref 0–44)
AST: 37 U/L (ref 15–41)
Albumin: 4.1 g/dL (ref 3.5–5.0)
Alkaline Phosphatase: 40 U/L (ref 38–126)
Anion gap: 9 (ref 5–15)
BUN: 10 mg/dL (ref 6–20)
CO2: 24 mmol/L (ref 22–32)
Calcium: 8.4 mg/dL — ABNORMAL LOW (ref 8.9–10.3)
Chloride: 109 mmol/L (ref 98–111)
Creatinine, Ser: 0.67 mg/dL (ref 0.44–1.00)
GFR, Estimated: >60 mL/min (ref >60)
Glucose, Bld: 127 mg/dL — ABNORMAL HIGH (ref 70–99)
Potassium: 3.6 mmol/L (ref 3.5–5.1)
Sodium: 142 mmol/L (ref 135–145)
Total Bilirubin: 0.5 mg/dL (ref 0.3–1.2)
Total Protein: 7 g/dL (ref 6.5–8.1)

## 2022-08-27 LAB — CBC
HCT: 38.2 % (ref 36.0–46.0)
Hemoglobin: 12.6 g/dL (ref 12.0–15.0)
MCH: 30.6 pg (ref 26.0–34.0)
MCHC: 33 g/dL (ref 30.0–36.0)
MCV: 92.7 fL (ref 80.0–100.0)
Platelets: 305 10*3/uL (ref 150–400)
RBC: 4.12 MIL/uL (ref 3.87–5.11)
RDW: 12 % (ref 11.5–15.5)
WBC: 14.8 10*3/uL — ABNORMAL HIGH (ref 4.0–10.5)
nRBC: 0 % (ref 0.0–0.2)

## 2022-08-27 LAB — ETHANOL: Alcohol, Ethyl (B): 177 mg/dL — ABNORMAL HIGH (ref <10)

## 2022-08-27 LAB — URINE DRUG SCREEN, QUALITATIVE (ARMC ONLY)
Amphetamines, Ur Screen: NOT DETECTED
Barbiturates, Ur Screen: NOT DETECTED
Benzodiazepine, Ur Scrn: NOT DETECTED
Cannabinoid 50 Ng, Ur ~~LOC~~: POSITIVE — AB
Cocaine Metabolite,Ur ~~LOC~~: NOT DETECTED
MDMA (Ecstasy)Ur Screen: NOT DETECTED
Methadone Scn, Ur: NOT DETECTED
Opiate, Ur Screen: NOT DETECTED
Phencyclidine (PCP) Ur S: NOT DETECTED
Tricyclic, Ur Screen: NOT DETECTED

## 2022-08-27 LAB — SAMPLE TO BLOOD BANK

## 2022-08-27 LAB — PREGNANCY, URINE: Preg Test, Ur: NEGATIVE

## 2022-08-27 MED ORDER — ONDANSETRON HCL 4 MG/2ML IJ SOLN
4.0000 mg | Freq: Four times a day (QID) | INTRAMUSCULAR | Status: DC | PRN
  Administered 2022-08-27: 4 mg via INTRAVENOUS
  Filled 2022-08-27: qty 2

## 2022-08-27 MED ORDER — SODIUM CHLORIDE 0.9% FLUSH: 3.0000 mL | INTRAVENOUS | Status: DC | PRN

## 2022-08-27 MED ORDER — ONDANSETRON HCL 4 MG/2ML IJ SOLN
4.0000 mg | Freq: Once | INTRAMUSCULAR | Status: AC
  Administered 2022-08-27: 4 mg via INTRAVENOUS
  Filled 2022-08-27: qty 2

## 2022-08-27 MED ORDER — IOHEXOL 300 MG/ML  SOLN
75.0000 mL | Freq: Once | INTRAMUSCULAR | Status: AC | PRN
  Administered 2022-08-27: 75 mL via INTRAVENOUS

## 2022-08-27 MED ORDER — SODIUM CHLORIDE 0.9% FLUSH
3.0000 mL | Freq: Two times a day (BID) | INTRAVENOUS | Status: DC
  Administered 2022-08-27 – 2022-08-29 (×4): 3 mL via INTRAVENOUS

## 2022-08-27 MED ORDER — SODIUM CHLORIDE 0.9 % IV SOLN: 250.0000 mL | INTRAVENOUS | Status: DC | PRN

## 2022-08-27 MED ORDER — ACETAMINOPHEN 325 MG PO TABS
650.0000 mg | ORAL_TABLET | Freq: Four times a day (QID) | ORAL | Status: DC | PRN
  Administered 2022-08-27 – 2022-08-29 (×4): 650 mg via ORAL
  Filled 2022-08-27 (×4): qty 2

## 2022-08-27 MED ORDER — MORPHINE SULFATE (PF) 4 MG/ML IV SOLN
4.0000 mg | Freq: Once | INTRAVENOUS | Status: AC
  Administered 2022-08-27: 4 mg via INTRAVENOUS
  Filled 2022-08-27: qty 1

## 2022-08-27 MED ORDER — HYDROMORPHONE HCL 1 MG/ML IJ SOLN
0.5000 mg | INTRAMUSCULAR | Status: DC | PRN
  Administered 2022-08-27 – 2022-08-28 (×6): 1 mg via INTRAVENOUS
  Filled 2022-08-27 (×6): qty 1

## 2022-08-27 MED ORDER — ONDANSETRON 4 MG PO TBDP
4.0000 mg | ORAL_TABLET | Freq: Four times a day (QID) | ORAL | Status: DC | PRN
  Administered 2022-08-27: 4 mg via ORAL
  Filled 2022-08-27: qty 1

## 2022-08-27 MED ORDER — SODIUM CHLORIDE 0.9 % IV BOLUS
1000.0000 mL | Freq: Once | INTRAVENOUS | Status: AC
  Administered 2022-08-27: 1000 mL via INTRAVENOUS

## 2022-08-27 MED ORDER — SODIUM CHLORIDE 0.9 % IV SOLN: INTRAVENOUS | Status: DC

## 2022-08-27 MED ORDER — OXYCODONE HCL 5 MG PO TABS
5.0000 mg | ORAL_TABLET | ORAL | Status: DC | PRN
  Administered 2022-08-27 – 2022-08-29 (×8): 5 mg via ORAL
  Filled 2022-08-27 (×8): qty 1

## 2022-08-27 MED ORDER — CEPHALEXIN 500 MG PO CAPS
500.0000 mg | ORAL_CAPSULE | Freq: Four times a day (QID) | ORAL | Status: DC
  Administered 2022-08-27 – 2022-08-29 (×10): 500 mg via ORAL
  Filled 2022-08-27 (×10): qty 1

## 2022-08-27 MED ORDER — TETANUS-DIPHTH-ACELL PERTUSSIS 5-2.5-18.5 LF-MCG/0.5 IM SUSY
0.5000 mL | PREFILLED_SYRINGE | Freq: Once | INTRAMUSCULAR | Status: AC
  Administered 2022-08-27: 0.5 mL via INTRAMUSCULAR
  Filled 2022-08-27: qty 0.5

## 2022-08-27 MED ORDER — CEFAZOLIN SODIUM-DEXTROSE 1-4 GM/50ML-% IV SOLN
1.0000 g | Freq: Once | INTRAVENOUS | Status: AC
  Administered 2022-08-27: 1 g via INTRAVENOUS
  Filled 2022-08-27: qty 50

## 2022-08-27 MED ORDER — ACETAMINOPHEN 650 MG RE SUPP: 650.0000 mg | Freq: Four times a day (QID) | RECTAL | Status: DC | PRN

## 2022-08-27 MED ORDER — LORAZEPAM 2 MG/ML IJ SOLN
1.0000 mg | Freq: Once | INTRAMUSCULAR | Status: AC
  Administered 2022-08-27: 1 mg via INTRAVENOUS
  Filled 2022-08-27: qty 1
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
## 2022-08-27 NOTE — ED Notes (Signed)
Patient transported to CT 

## 2022-08-27 NOTE — ED Notes (Signed)
Bed and gown changed. New purewick placed.
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
## 2022-08-27 NOTE — ED Notes (Signed)
Dressing applied to wound on left arm.

## 2022-08-28 ENCOUNTER — Encounter: Payer: Self-pay | Admitting: Family Medicine

## 2022-08-28 DIAGNOSIS — G4730 Sleep apnea, unspecified: Secondary | ICD-10-CM | POA: Diagnosis present

## 2022-08-28 DIAGNOSIS — Z800 Family history of malignant neoplasm of digestive organs: Secondary | ICD-10-CM | POA: Diagnosis not present

## 2022-08-28 DIAGNOSIS — S022XXA Fracture of nasal bones, initial encounter for closed fracture: Secondary | ICD-10-CM

## 2022-08-28 DIAGNOSIS — F10129 Alcohol abuse with intoxication, unspecified: Secondary | ICD-10-CM | POA: Diagnosis present

## 2022-08-28 DIAGNOSIS — S0231XA Fracture of orbital floor, right side, initial encounter for closed fracture: Secondary | ICD-10-CM | POA: Diagnosis present

## 2022-08-28 DIAGNOSIS — S0285XA Fracture of orbit, unspecified, initial encounter for closed fracture: Secondary | ICD-10-CM | POA: Diagnosis not present

## 2022-08-28 DIAGNOSIS — H1131 Conjunctival hemorrhage, right eye: Secondary | ICD-10-CM | POA: Diagnosis present

## 2022-08-28 DIAGNOSIS — F17290 Nicotine dependence, other tobacco product, uncomplicated: Secondary | ICD-10-CM | POA: Diagnosis present

## 2022-08-28 DIAGNOSIS — F4312 Post-traumatic stress disorder, chronic: Secondary | ICD-10-CM | POA: Diagnosis present

## 2022-08-28 DIAGNOSIS — Z8041 Family history of malignant neoplasm of ovary: Secondary | ICD-10-CM | POA: Diagnosis not present

## 2022-08-28 DIAGNOSIS — S62326A Displaced fracture of shaft of fifth metacarpal bone, right hand, initial encounter for closed fracture: Secondary | ICD-10-CM | POA: Diagnosis present

## 2022-08-28 DIAGNOSIS — Z23 Encounter for immunization: Secondary | ICD-10-CM | POA: Diagnosis not present

## 2022-08-28 DIAGNOSIS — S62322A Displaced fracture of shaft of third metacarpal bone, right hand, initial encounter for closed fracture: Secondary | ICD-10-CM | POA: Diagnosis present

## 2022-08-28 DIAGNOSIS — Z880 Allergy status to penicillin: Secondary | ICD-10-CM | POA: Diagnosis not present

## 2022-08-28 DIAGNOSIS — Y906 Blood alcohol level of 120-199 mg/100 ml: Secondary | ICD-10-CM | POA: Diagnosis present

## 2022-08-28 DIAGNOSIS — Z8049 Family history of malignant neoplasm of other genital organs: Secondary | ICD-10-CM | POA: Diagnosis not present

## 2022-08-28 DIAGNOSIS — Y92410 Unspecified street and highway as the place of occurrence of the external cause: Secondary | ICD-10-CM | POA: Diagnosis not present

## 2022-08-28 DIAGNOSIS — W2209XA Striking against other stationary object, initial encounter: Secondary | ICD-10-CM | POA: Diagnosis present

## 2022-08-28 HISTORY — DX: Fracture of nasal bones, initial encounter for closed fracture: S02.2XXA

## 2022-08-28 LAB — HIV ANTIBODY (ROUTINE TESTING W REFLEX): HIV Screen 4th Generation wRfx: NONREACTIVE

## 2022-08-28 MED ORDER — ERYTHROMYCIN 5 MG/GM OP OINT
TOPICAL_OINTMENT | Freq: Three times a day (TID) | OPHTHALMIC | Status: DC
  Administered 2022-08-28 – 2022-08-29 (×2): 1 via OPHTHALMIC
  Filled 2022-08-28: qty 1

## 2022-08-28 MED ORDER — BACITRACIN ZINC 500 UNIT/GM EX OINT: TOPICAL_OINTMENT | Freq: Two times a day (BID) | CUTANEOUS | Status: DC

## 2022-08-28 MED ORDER — BACITRACIN ZINC 500 UNIT/GM EX OINT
TOPICAL_OINTMENT | Freq: Two times a day (BID) | CUTANEOUS | Status: DC
  Filled 2022-08-28 (×2): qty 0.9
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
## 2022-08-28 NOTE — Plan of Care (Signed)

## 2022-08-29 DIAGNOSIS — S022XXA Fracture of nasal bones, initial encounter for closed fracture: Secondary | ICD-10-CM | POA: Diagnosis not present

## 2022-08-29 DIAGNOSIS — S0285XA Fracture of orbit, unspecified, initial encounter for closed fracture: Secondary | ICD-10-CM | POA: Diagnosis not present

## 2022-08-29 LAB — BASIC METABOLIC PANEL
Anion gap: 5 (ref 5–15)
BUN: 8 mg/dL (ref 6–20)
CO2: 27 mmol/L (ref 22–32)
Calcium: 8.5 mg/dL — ABNORMAL LOW (ref 8.9–10.3)
Chloride: 107 mmol/L (ref 98–111)
Creatinine, Ser: 0.54 mg/dL (ref 0.44–1.00)
GFR, Estimated: >60 mL/min (ref >60)
Glucose, Bld: 80 mg/dL (ref 70–99)
Potassium: 3.3 mmol/L — ABNORMAL LOW (ref 3.5–5.1)
Sodium: 139 mmol/L (ref 135–145)

## 2022-08-29 LAB — CBC
HCT: 32.5 % — ABNORMAL LOW (ref 36.0–46.0)
Hemoglobin: 10.8 g/dL — ABNORMAL LOW (ref 12.0–15.0)
MCH: 31.3 pg (ref 26.0–34.0)
MCHC: 33.2 g/dL (ref 30.0–36.0)
MCV: 94.2 fL (ref 80.0–100.0)
Platelets: 184 10*3/uL (ref 150–400)
RBC: 3.45 MIL/uL — ABNORMAL LOW (ref 3.87–5.11)
RDW: 11.9 % (ref 11.5–15.5)
WBC: 7 10*3/uL (ref 4.0–10.5)
nRBC: 0 % (ref 0.0–0.2)

## 2022-08-29 LAB — PHOSPHORUS: Phosphorus: 2.1 mg/dL — ABNORMAL LOW (ref 2.5–4.6)

## 2022-08-29 LAB — MAGNESIUM: Magnesium: 2 mg/dL (ref 1.7–2.4)

## 2022-08-29 MED ORDER — ERYTHROMYCIN 5 MG/GM OP OINT: TOPICAL_OINTMENT | Freq: Three times a day (TID) | OPHTHALMIC | 0 refills | Status: AC

## 2022-08-29 MED ORDER — ACETAMINOPHEN 325 MG PO TABS: 650.0000 mg | ORAL_TABLET | Freq: Four times a day (QID) | ORAL | Status: DC | PRN

## 2022-08-29 MED ORDER — CEPHALEXIN 500 MG PO CAPS: 500.0000 mg | ORAL_CAPSULE | Freq: Four times a day (QID) | ORAL | 0 refills | Status: AC

## 2022-08-29 NOTE — Plan of Care (Signed)
  Problem: Activity: Goal: Risk for activity intolerance will decrease Outcome: Progressing   Problem: Nutrition: Goal: Adequate nutrition will be maintained Outcome: Progressing   Problem: Coping: Goal: Level of anxiety will decrease Outcome: Progressing   Problem: Pain Managment: Goal: General experience of comfort will improve Outcome: Progressing   Problem: Safety: Goal: Ability to remain free from injury will improve Outcome: Progressing   
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
## 2023-01-13 ENCOUNTER — Encounter: Payer: Self-pay | Admitting: Family Medicine

## 2023-01-13 ENCOUNTER — Ambulatory Visit: Payer: Managed Care, Other (non HMO) | Admitting: Family Medicine

## 2023-01-13 DIAGNOSIS — A630 Anogenital (venereal) warts: Secondary | ICD-10-CM

## 2023-01-13 DIAGNOSIS — Z113 Encounter for screening for infections with a predominantly sexual mode of transmission: Secondary | ICD-10-CM

## 2023-01-13 LAB — WET PREP FOR TRICH, YEAST, CLUE
Trichomonas Exam: NEGATIVE
Yeast Exam: NEGATIVE

## 2023-01-13 LAB — HM HIV SCREENING LAB: HM HIV Screening: NEGATIVE

## 2023-01-13 NOTE — Progress Notes (Signed)
Pt is here for STD screening.  Wet mount results reviewed, no treatment required per SO.  Condoms declined.  Windle Guard, RN
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
## 2023-01-27 ENCOUNTER — Ambulatory Visit: Payer: Managed Care, Other (non HMO)

## 2023-02-13 ENCOUNTER — Ambulatory Visit (LOCAL_COMMUNITY_HEALTH_CENTER): Payer: Managed Care, Other (non HMO)

## 2023-02-13 ENCOUNTER — Ambulatory Visit: Payer: Managed Care, Other (non HMO)

## 2023-02-13 DIAGNOSIS — Z719 Counseling, unspecified: Secondary | ICD-10-CM

## 2023-02-13 NOTE — Progress Notes (Signed)
Pt seen in nurse clinic for HPV vaccine. Reviewed [facility], pt received and completed 2 dose of HPV. Informed pt that she completed 2 dose of HPV and doesn't need another dose. Pt states that she has urinary tract infection, advised pt to see PCP or Urgent Care, pt agreed. Pt also states that she was seen here for warts tx on 01/13/23 and warts still between her legs and vagina and it bothers her, advised pt to make an appt to be seen by our provider, pt states I will call to make an appointment. No vaccine given today. Given pt [facility] copy, verbalized understanding. M.Jaben Benegas, LPN.

## 2023-03-21 ENCOUNTER — Ambulatory Visit (INDEPENDENT_AMBULATORY_CARE_PROVIDER_SITE_OTHER): Payer: Managed Care, Other (non HMO) | Admitting: Family

## 2023-03-21 ENCOUNTER — Encounter: Payer: Self-pay | Admitting: Family

## 2023-03-21 VITALS — BP 112/60 | HR 85 | Ht 64.0 in | Wt 123.0 lb

## 2023-03-21 DIAGNOSIS — M62830 Muscle spasm of back: Secondary | ICD-10-CM

## 2023-03-21 MED ORDER — MELOXICAM 7.5 MG PO TABS: 7.5000 mg | ORAL_TABLET | Freq: Two times a day (BID) | ORAL | 1 refills | Status: AC

## 2023-03-21 MED ORDER — METHOCARBAMOL 500 MG PO TABS: 500.0000 mg | ORAL_TABLET | Freq: Four times a day (QID) | ORAL | 5 refills | Status: AC
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
## 2023-04-08 ENCOUNTER — Ambulatory Visit: Payer: Managed Care, Other (non HMO) | Admitting: Family Medicine

## 2023-04-08 ENCOUNTER — Encounter: Payer: Self-pay | Admitting: Family Medicine

## 2023-04-08 ENCOUNTER — Ambulatory Visit: Payer: Managed Care, Other (non HMO)

## 2023-04-08 DIAGNOSIS — A630 Anogenital (venereal) warts: Secondary | ICD-10-CM | POA: Insufficient documentation

## 2023-04-08 DIAGNOSIS — Z113 Encounter for screening for infections with a predominantly sexual mode of transmission: Secondary | ICD-10-CM

## 2023-04-08 LAB — WET PREP FOR TRICH, YEAST, CLUE
Trichomonas Exam: NEGATIVE
Yeast Exam: NEGATIVE
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
## 2023-04-08 NOTE — Progress Notes (Signed)
Pt is here for STD screening.  Wet mount results reviewed, no treatment required.   Condoms declined.  Alayna Mabe M Gwenneth Whiteman, RN  

## 2023-04-17 ENCOUNTER — Ambulatory Visit (INDEPENDENT_AMBULATORY_CARE_PROVIDER_SITE_OTHER): Payer: Managed Care, Other (non HMO) | Admitting: Family

## 2023-04-17 ENCOUNTER — Encounter: Payer: Self-pay | Admitting: Family

## 2023-04-17 VITALS — BP 102/70 | HR 101 | Ht 64.0 in | Wt 124.2 lb

## 2023-04-17 DIAGNOSIS — R229 Localized swelling, mass and lump, unspecified: Secondary | ICD-10-CM

## 2023-04-17 MED ORDER — SULFAMETHOXAZOLE-TRIMETHOPRIM 800-160 MG PO TABS: 1.0000 | ORAL_TABLET | Freq: Two times a day (BID) | ORAL | 0 refills | Status: DC

## 2023-04-19 ENCOUNTER — Encounter: Payer: Self-pay | Admitting: Family
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
## 2023-07-07 ENCOUNTER — Ambulatory Visit (INDEPENDENT_AMBULATORY_CARE_PROVIDER_SITE_OTHER): Payer: Managed Care, Other (non HMO) | Admitting: Family

## 2023-07-07 VITALS — BP 110/69 | HR 82 | Ht 64.0 in | Wt 120.8 lb

## 2023-07-07 DIAGNOSIS — N921 Excessive and frequent menstruation with irregular cycle: Secondary | ICD-10-CM
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
## 2023-07-11 ENCOUNTER — Other Ambulatory Visit: Payer: Self-pay

## 2023-07-11 MED ORDER — METRONIDAZOLE 500 MG PO TABS: 500.0000 mg | ORAL_TABLET | Freq: Two times a day (BID) | ORAL | 0 refills | Status: DC

## 2023-07-13 ENCOUNTER — Encounter: Payer: Self-pay | Admitting: Family

## 2023-07-22 ENCOUNTER — Encounter: Payer: Managed Care, Other (non HMO) | Admitting: Family

## 2023-09-04 NOTE — Patient Instructions (Signed)
Preventive Care 21-25 Years Old, Female Preventive care refers to lifestyle choices and visits with your health care provider that can promote health and wellness. Preventive care visits are also called wellness exams. What can I expect for my preventive care visit? Counseling During your preventive care visit, your health care provider may ask about your: Medical history, including: Past medical problems. Family medical history. Pregnancy history. Current health, including: Menstrual cycle. Method of birth control. Emotional well-being. Home life and relationship well-being. Sexual activity and sexual health. Lifestyle, including: Alcohol, nicotine or tobacco, and drug use. Access to firearms. Diet, exercise, and sleep habits. Work and work environment. Sunscreen use. Safety issues such as seatbelt and bike helmet use. Physical exam Your health care provider may check your: Height and weight. These may be used to calculate your BMI (body mass index). BMI is a measurement that tells if you are at a healthy weight. Waist circumference. This measures the distance around your waistline. This measurement also tells if you are at a healthy weight and may help predict your risk of certain diseases, such as type 2 diabetes and high blood pressure. Heart rate and blood pressure. Body temperature. Skin for abnormal spots. What immunizations do I need?  Vaccines are usually given at various ages, according to a schedule. Your health care provider will recommend vaccines for you based on your age, medical history, and lifestyle or other factors, such as travel or where you work. What tests do I need? Screening Your health care provider may recommend screening tests for certain conditions. This may include: Pelvic exam and Pap test. Lipid and cholesterol levels. Diabetes screening. This is done by checking your blood sugar (glucose) after you have not eaten for a while (fasting). Hepatitis  B test. Hepatitis C test. HIV (human immunodeficiency virus) test. STI (sexually transmitted infection) testing, if you are at risk. BRCA-related cancer screening. This may be done if you have a family history of breast, ovarian, tubal, or peritoneal cancers. Talk with your health care provider about your test results, treatment options, and if necessary, the need for more tests. Follow these instructions at home: Eating and drinking  Eat a healthy diet that includes fresh fruits and vegetables, whole grains, lean protein, and low-fat dairy products. Take vitamin and mineral supplements as recommended by your health care provider. Do not drink alcohol if: Your health care provider tells you not to drink. You are pregnant, may be pregnant, or are planning to become pregnant. If you drink alcohol: Limit how much you have to 0-1 drink a day. Know how much alcohol is in your drink. In the [place] one drink equals one 12 oz bottle of beer (355 mL), one 5 oz glass of wine (148 mL), or one 1 oz glass of hard liquor (44 mL). Lifestyle Brush your teeth every morning and night with fluoride toothpaste. Floss one time each day. Exercise for at least 30 minutes 5 or more days each week. Do not use any products that contain nicotine or tobacco. These products include cigarettes, chewing tobacco, and vaping devices, such as e-cigarettes. If you need help quitting, ask your health care provider. Do not use drugs. If you are sexually active, practice safe sex. Use a condom or other form of protection to prevent STIs. If you do not wish to become pregnant, use a form of birth control. If you plan to become pregnant, see your health care provider for a prepregnancy visit. Find healthy ways to manage stress, such as: Meditation,   yoga, or listening to music. Journaling. Talking to a trusted person. Spending time with friends and family. Minimize exposure to UV radiation to reduce your risk of skin  cancer. Safety Always wear your seat belt while driving or riding in a vehicle. Do not drive: If you have been drinking alcohol. Do not ride with someone who has been drinking. If you have been using any mind-altering substances or drugs. While texting. When you are tired or distracted. Wear a helmet and other protective equipment during sports activities. If you have firearms in your house, make sure you follow all gun safety procedures. Seek help if you have been physically or sexually abused. What's next? Go to your health care provider once a year for an annual wellness visit. Ask your health care provider how often you should have your eyes and teeth checked. Stay up to date on all vaccines. This information is not intended to replace advice given to you by your health care provider. Make sure you discuss any questions you have with your health care provider. Document Revised: 05/16/2021 Document Reviewed: 05/16/2021 Elsevier Patient Education  2024 Elsevier Inc. Breast Self-Awareness Breast self-awareness is knowing how your breasts look and feel. You need to: Check your breasts on a regular basis. Tell your doctor about any changes. Become familiar with the look and feel of your breasts. This can help you catch a breast problem while it is still small and can be treated. You should do breast self-exams even if you have breast implants. What you need: A mirror. A well-lit room. A pillow or other soft object. How to do a breast self-exam Follow these steps to do a breast self-exam: Look for changes  Take off all the clothes above your waist. Stand in front of a mirror in a room with good lighting. Put your hands down at your sides. Compare your breasts in the mirror. Look for any difference between them, such as: A difference in shape. A difference in size. Wrinkles, dips, and bumps in one breast and not the other. Look at each breast for changes in the skin, such  as: Redness. Scaly areas. Skin that has gotten thicker. Dimpling. Open sores (ulcers). Look for changes in your nipples, such as: Fluid coming out of a nipple. Fluid around a nipple. Bleeding. Dimpling. Redness. A nipple that looks pushed in (retracted), or that has changed position. Feel for changes Lie on your back. Feel each breast. To do this: Pick a breast to feel. Place a pillow under the shoulder closest to that breast. Put the arm closest to that breast behind your head. Feel the nipple area of that breast using the hand of your other arm. Feel the area with the pads of your three middle fingers by making small circles with your fingers. Use light, medium, and firm pressure. Continue the overlapping circles, moving downward over the breast. Keep making circles with your fingers. Stop when you feel your ribs. Start making circles with your fingers again, this time going upward until you reach your collarbone. Then, make circles outward across your breast and into your armpit area. Squeeze your nipple. Check for discharge and lumps. Repeat these steps to check your other breast. Sit or stand in the tub or shower. With soapy water on your skin, feel each breast the same way you did when you were lying down. Write down what you find Writing down what you find can help you remember what to tell your doctor. Write down: What is   normal for each breast. Any changes you find in each breast. These include: The kind of changes you find. A tender or painful breast. Any lump you find. Write down its size and where it is. When you last had your monthly period (menstrual cycle). General tips If you are breastfeeding, the best time to check your breasts is after you feed your baby or after you use a breast pump. If you get monthly bleeding, the best time to check your breasts is 5-7 days after your monthly cycle ends. With time, you will become comfortable with the self-exam. You will  also start to know if there are changes in your breasts. Contact a doctor if: You see a change in the shape or size of your breasts or nipples. You see a change in the skin of your breast or nipples, such as red or scaly skin. You have fluid coming from your nipples that is not normal. You find a new lump or thick area. You have breast pain. You have any concerns about your breast health. Summary Breast self-awareness includes looking for changes in your breasts and feeling for changes within your breasts. You should do breast self-awareness in front of a mirror in a well-lit room. If you get monthly periods (menstrual cycles), the best time to check your breasts is 5-7 days after your period ends. Tell your doctor about any changes you see in your breasts. Changes include changes in size, changes on the skin, painful or tender breasts, or fluid from your nipples that is not normal. This information is not intended to replace advice given to you by your health care provider. Make sure you discuss any questions you have with your health care provider. Document Revised: 04/25/2022 Document Reviewed: 09/20/2021 Elsevier Patient Education  2024 Elsevier Inc.  
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
## 2023-09-08 ENCOUNTER — Other Ambulatory Visit (HOSPITAL_COMMUNITY)
Admission: RE | Admit: 2023-09-08 | Discharge: 2023-09-08 | Disposition: A | Discharge status: Home / Self Care | Payer: Managed Care, Other (non HMO) | Source: Ambulatory Visit | Attending: Obstetrics | Admitting: Obstetrics

## 2023-09-08 ENCOUNTER — Encounter: Payer: Self-pay | Admitting: Obstetrics

## 2023-09-08 ENCOUNTER — Ambulatory Visit: Payer: Managed Care, Other (non HMO) | Admitting: Obstetrics

## 2023-09-08 VITALS — BP 105/67 | HR 84 | Ht 65.0 in | Wt 120.7 lb

## 2023-09-08 DIAGNOSIS — Z124 Encounter for screening for malignant neoplasm of cervix: Secondary | ICD-10-CM | POA: Diagnosis present

## 2023-09-08 DIAGNOSIS — Z01419 Encounter for gynecological examination (general) (routine) without abnormal findings: Secondary | ICD-10-CM | POA: Insufficient documentation

## 2023-09-08 DIAGNOSIS — N9089 Other specified noninflammatory disorders of vulva and perineum: Secondary | ICD-10-CM

## 2023-09-08 NOTE — Addendum Note (Signed)
Addended by: Tommie Raymond on: 09/08/2023 09:22 AM   Modules accepted: Orders

## 2023-09-09 ENCOUNTER — Encounter: Payer: Managed Care, Other (non HMO) | Admitting: Family Medicine

## 2023-09-10 NOTE — Assessment & Plan Note (Deleted)
sample

## 2023-09-11 LAB — CYTOLOGY - PAP
Chlamydia: NEGATIVE
Comment: NEGATIVE
Comment: NORMAL
Diagnosis: NEGATIVE
Neisseria Gonorrhea: NEGATIVE

## 2023-09-11 NOTE — Progress Notes (Signed)
Erroneous encounter

## 2023-09-12 NOTE — Progress Notes (Signed)
    GYNECOLOGY PROCEDURE NOTE  Subjective:    Patient ID: Janet Leon, female    DOB: [birth year], 27 y.o.   MRN: [id number]  HPI  Patient is a 27 y.o. G92P0001 female who presents for genital lesion removal.   The following portions of the patient's history were reviewed and updated as appropriate: allergies, current medications, past family history, past medical history, past social history, past surgical history, and problem list.  Review of Systems Pertinent items are noted in HPI.   Objective:   Last menstrual period 08/25/2023. There is no height or weight on file to calculate BMI. General appearance: alert and cooperative Vulva: 3 lesions, raised and hyperpigmented, mons: 1mm, L labia majora: superior 3mm, inferior 1mm Extremities: extremities normal, atraumatic, no cyanosis or edema Neurologic: Grossly normal  LESION REMOVAL PROCEDURE Procedure: Lesion removal x 3 Informed consent:  Discussed risks (permanent scarring, infection, pain, bleeding, bruising, redness, and recurrence of the lesion) and benefits of the procedure, as well as the alternatives. Informed consent was obtained. Anesthesia: 1% Lidocaine without epinephrine; 0.1cc  The area was prepared and draped in a standard fashion. Snip removal was performed x 3 Antibiotic ointment and a sterile dressing were applied.   The patient tolerated procedure well. The patient was instructed on post-op care.   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 Excised today as per procedure note, no complications and pt tolerated well. -Specimens sent to pathology -Aftercare instructions reviewed and handout provided; si/sx of infection or wound disruption reviewed. -May take Tylenol / Ibuprofen for pain -Will call with pathology results.    [name], DO [facility]

## 2023-09-15 ENCOUNTER — Ambulatory Visit: Payer: Managed Care, Other (non HMO) | Admitting: Obstetrics

## 2023-09-15 ENCOUNTER — Other Ambulatory Visit (HOSPITAL_COMMUNITY)
Admission: RE | Admit: 2023-09-15 | Discharge: 2023-09-15 | Disposition: A | Discharge status: Home / Self Care | Payer: Managed Care, Other (non HMO) | Source: Ambulatory Visit | Attending: Obstetrics | Admitting: Obstetrics

## 2023-09-15 ENCOUNTER — Encounter: Payer: Self-pay | Admitting: Obstetrics

## 2023-09-15 VITALS — BP 92/72 | HR 77 | Ht 64.0 in | Wt 122.0 lb

## 2023-09-15 DIAGNOSIS — N9089 Other specified noninflammatory disorders of vulva and perineum: Secondary | ICD-10-CM

## 2023-09-15 DIAGNOSIS — A630 Anogenital (venereal) warts: Secondary | ICD-10-CM | POA: Diagnosis present

## 2023-09-16 LAB — SURGICAL PATHOLOGY

## 2023-09-17 NOTE — Progress Notes (Signed)
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 Nothing further to do since we excised them.

## 2023-10-17 ENCOUNTER — Ambulatory Visit: Payer: Managed Care, Other (non HMO) | Admitting: Family

## 2023-10-17 VITALS — BP 102/60 | HR 103 | Ht 64.0 in | Wt 116.6 lb

## 2023-10-17 DIAGNOSIS — Z202 Contact with and (suspected) exposure to infections with a predominantly sexual mode of transmission: Secondary | ICD-10-CM

## 2023-10-17 DIAGNOSIS — Z114 Encounter for screening for human immunodeficiency virus [HIV]: Secondary | ICD-10-CM

## 2023-10-22 ENCOUNTER — Telehealth: Payer: Self-pay | Admitting: Family

## 2023-10-22 LAB — NUSWAB VG+, HSV
Atopobium vaginae: HIGH {score} — AB
BVAB 2: HIGH {score} — AB
Candida albicans, NAA: NEGATIVE
Candida glabrata, NAA: NEGATIVE
Chlamydia trachomatis, NAA: UNDETERMINED — AB
HSV 1 NAA: NEGATIVE
HSV 2 NAA: NEGATIVE
Megasphaera 1: HIGH {score} — AB
Neisseria gonorrhoeae, NAA: NEGATIVE
Trich vag by NAA: NEGATIVE

## 2023-10-22 LAB — RPR W/REFLEX TO TREPSURE: RPR: NONREACTIVE

## 2023-10-22 LAB — HAV, HBV PANEL
Hep B Core Total Ab: NEGATIVE
Hep B Surface Ab, Qual: NONREACTIVE
Hepatitis B Surface Ag: NEGATIVE
hep A Total Ab: POSITIVE — AB

## 2023-10-22 LAB — TREPONEMAL ANTIBODIES, TPPA: Treponemal Antibodies, TPPA: NONREACTIVE

## 2023-10-22 LAB — HEPATITIS A ANTIBODY, IGM: Hep A IgM: NEGATIVE

## 2023-10-22 LAB — HIV ANTIBODY (ROUTINE TESTING W REFLEX): HIV Screen 4th Generation wRfx: NONREACTIVE

## 2023-10-22 MED ORDER — DOXYCYCLINE HYCLATE 100 MG PO CAPS: 100.0000 mg | ORAL_CAPSULE | Freq: Two times a day (BID) | ORAL | 0 refills | Status: AC

## 2023-10-22 MED ORDER — METRONIDAZOLE 500 MG PO TABS: 500.0000 mg | ORAL_TABLET | Freq: Two times a day (BID) | ORAL | 0 refills | Status: AC

## 2023-10-22 NOTE — Telephone Encounter (Signed)
Patient called inquiring about her recent test results. Please advise.

## 2023-10-27 ENCOUNTER — Ambulatory Visit: Payer: Managed Care, Other (non HMO)

## 2023-10-29 LAB — NUSWAB VG+, HSV
Candida albicans, NAA: NEGATIVE
Candida glabrata, NAA: NEGATIVE
Chlamydia trachomatis, NAA: NEGATIVE
HSV 1 NAA: NEGATIVE
HSV 2 NAA: NEGATIVE
Neisseria gonorrhoeae, NAA: NEGATIVE
Trich vag by NAA: NEGATIVE

## 2023-11-01 ENCOUNTER — Encounter: Payer: Self-pay | Admitting: Family

## 2023-12-14 ENCOUNTER — Encounter: Payer: Self-pay | Admitting: Family

## 2024-02-10 ENCOUNTER — Ambulatory Visit: Admitting: Family

## 2024-02-10 DIAGNOSIS — Z202 Contact with and (suspected) exposure to infections with a predominantly sexual mode of transmission: Secondary | ICD-10-CM

## 2024-02-10 DIAGNOSIS — N760 Acute vaginitis: Secondary | ICD-10-CM

## 2024-02-12 ENCOUNTER — Other Ambulatory Visit: Payer: Self-pay

## 2024-02-12 LAB — NUSWAB VAGINITIS PLUS (VG+)
Candida albicans, NAA: NEGATIVE
Candida glabrata, NAA: NEGATIVE
Chlamydia trachomatis, NAA: NEGATIVE
Megasphaera 1: HIGH {score} — AB
Neisseria gonorrhoeae, NAA: NEGATIVE
Trich vag by NAA: NEGATIVE

## 2024-02-12 MED ORDER — METRONIDAZOLE 500 MG PO TABS: 500.0000 mg | ORAL_TABLET | Freq: Two times a day (BID) | ORAL | 0 refills | Status: AC

## 2024-02-16 ENCOUNTER — Ambulatory Visit: Admitting: Family
